# Patient Record
Sex: Male | Born: 1980 | Race: White | Hispanic: No | Marital: Married | Smoking: Never smoker
Health system: Southern US, Community
[De-identification: ages and names within clinical notes are randomized; demographics above are authoritative.]

## PROBLEM LIST (undated history)

## (undated) DIAGNOSIS — K4090 Unilateral inguinal hernia, without obstruction or gangrene, not specified as recurrent: Secondary | ICD-10-CM

## (undated) HISTORY — DX: Unilateral inguinal hernia, without obstruction or gangrene, not specified as recurrent: K40.90

---

## 2007-10-28 ENCOUNTER — Ambulatory Visit: Payer: Self-pay | Admitting: General Surgery

## 2007-10-28 HISTORY — PX: HERNIA REPAIR: SHX51

## 2015-06-03 ENCOUNTER — Encounter: Payer: Self-pay | Admitting: Family Medicine

## 2015-06-03 ENCOUNTER — Ambulatory Visit (INDEPENDENT_AMBULATORY_CARE_PROVIDER_SITE_OTHER): Payer: BLUE CROSS/BLUE SHIELD | Admitting: Family Medicine

## 2015-06-03 VITALS — BP 116/78 | HR 89 | Temp 98.6°F | Resp 16 | Wt 171.0 lb

## 2015-06-03 DIAGNOSIS — J069 Acute upper respiratory infection, unspecified: Secondary | ICD-10-CM | POA: Diagnosis not present

## 2015-06-03 DIAGNOSIS — R05 Cough: Secondary | ICD-10-CM | POA: Diagnosis not present

## 2015-06-03 DIAGNOSIS — R059 Cough, unspecified: Secondary | ICD-10-CM

## 2015-06-03 MED ORDER — PROMETHAZINE-DM 6.25-15 MG/5ML PO SYRP: 5.0000 mL | ORAL_SOLUTION | Freq: Four times a day (QID) | ORAL | Status: DC | PRN

## 2015-06-03 NOTE — Progress Notes (Signed)
Patient ID: Ronald Graham, male   DOB: 1981-07-12, 34 y.o.   MRN: [id number]   Patient: Ronald Graham Male    DOB: 1980/11/08   34 y.o.   MRN: [id number] Visit Date: 06/03/2015  Today's Provider: Dortha Kern, PA   Chief Complaint  Patient presents with  . Cough    X 2 weeks   Subjective:    Cough This is a new problem. The current episode started 1 to 4 weeks ago. The problem has been gradually worsening. The problem occurs constantly. The cough is productive of sputum.   There are no active problems to display for this patient.  Past Surgical History  Procedure Laterality Date  . Hernia repair  1983   Family History  Problem Relation Age of Onset  . Hernia Brother    Allergies  Allergen Reactions  . Amoxicillin    Previous Medications   No medications on file   Review of Systems  Constitutional: Negative.   HENT: Negative.   Eyes: Negative.   Respiratory: Positive for cough.   Cardiovascular: Negative.   Gastrointestinal: Negative.   Endocrine: Negative.   Genitourinary: Negative.   Musculoskeletal: Negative.   Skin: Negative.   Allergic/Immunologic: Negative.   Neurological: Negative.   Hematological: Negative.   Psychiatric/Behavioral: Negative.    Social History  Substance Use Topics  . Smoking status: Never Smoker   . Smokeless tobacco: Former Neurosurgeon     Comment: QUIT IN 2016  . Alcohol Use: No   Objective:   BP 116/78 mmHg  Pulse 89  Temp(Src) 98.6 F (37 C) (Oral)  Resp 16  Wt 171 lb (77.565 kg)  SpO2 98%  Physical Exam  Constitutional: He is oriented to person, place, and time. He appears well-developed and well-nourished. No distress.  HENT:  Head: Normocephalic and atraumatic.  Right Ear: Hearing normal.  Left Ear: Hearing normal.  Nose: Nose normal.  Eyes: Conjunctivae, EOM and lids are normal. Right eye exhibits no discharge. Left eye exhibits no discharge. No scleral icterus.  Neck: Normal range of motion. Neck supple.   Cardiovascular: Normal rate and regular rhythm.   Pulmonary/Chest: Effort normal. No respiratory distress.  Abdominal: Soft. Bowel sounds are normal.  Musculoskeletal: Normal range of motion.  Neurological: He is alert and oriented to person, place, and time.  Skin: Skin is intact. No lesion and no rash noted.  Psychiatric: He has a normal mood and affect. His speech is normal and behavior is normal. Thought content normal.      Assessment & Plan:     1. Cough Intermittent over the past couple weeks. No fever and occasional clear sputum production. Masayoshi treat with cough syrup and should recheck if no better in 5-7 days. - promethazine-dextromethorphan (PROMETHAZINE-DM) 6.25-15 MG/5ML syrup; Take 5 mLs by mouth 4 (four) times daily as needed for cough.  Dispense: 118 mL; Refill: 0  2. Upper respiratory infection No nasal congestion today. Son had similar symptoms of cough and congestion but he is all clear now. Increase fluid intake and use cough syrup prn. Recheck prn.

## 2015-08-29 ENCOUNTER — Ambulatory Visit (INDEPENDENT_AMBULATORY_CARE_PROVIDER_SITE_OTHER): Payer: BLUE CROSS/BLUE SHIELD | Admitting: Family Medicine

## 2015-08-29 ENCOUNTER — Encounter: Payer: Self-pay | Admitting: Family Medicine

## 2015-08-29 VITALS — BP 120/70 | HR 78 | Temp 98.6°F | Resp 16 | Wt 171.2 lb

## 2015-08-29 DIAGNOSIS — R1032 Left lower quadrant pain: Secondary | ICD-10-CM | POA: Diagnosis not present

## 2015-08-29 DIAGNOSIS — N50812 Left testicular pain: Secondary | ICD-10-CM | POA: Diagnosis not present

## 2015-08-29 MED ORDER — KETOROLAC TROMETHAMINE 60 MG/2ML IM SOLN
60.0000 mg | Freq: Once | INTRAMUSCULAR | Status: AC
  Administered 2015-08-29: 60 mg via INTRAMUSCULAR

## 2015-08-29 NOTE — Patient Instructions (Signed)
May

## 2015-08-29 NOTE — Progress Notes (Signed)
Subjective:     Patient ID: Ronald Graham, male   DOB: Jan 18, 1981, 34 y.o.   MRN: [id number]  HPI  Chief Complaint  Patient presents with  . Groin Pain    Patient states that yesterday while at work he felt like he had pulled a muscle in his groin, later on in the evening pain increased and patient went to [facility] urgent care. Patient was diagnosed with an inguinal hernia, patient would like to discuss pain managment and surgery for the near future  Reports prior hx of left inguinal hernia repair per Dr. Lemar LivingsByrnett years ago.Married and monogamous. States pain at times radiates to his left testicle. Treating himself with Tylenol.   Review of Systems  Constitutional: Negative for fever and chills.  Genitourinary: Negative for dysuria.       Objective:   Physical Exam  Constitutional: He appears well-developed and well-nourished. He appears distressed (moderate pain).  Genitourinary:  No bulge noted with Valsalva. Tender over left groin, testicle, and inguinal canal. No testicle mass.       Assessment:    1. Groin pain, left - Ambulatory referral to General Surgery - ketorolac (TORADOL) injection 60 mg; Inject 2 mLs (60 mg total) into the muscle once.  2. Pain in left testicle - Ambulatory referral to General Surgery    Plan:    Defers narcotic medication. If pain worsens to report to the ER.

## 2015-08-30 ENCOUNTER — Telehealth: Payer: Self-pay | Admitting: Family Medicine

## 2015-08-30 NOTE — Telephone Encounter (Signed)
States his pain is about the same but localized to his groin. He has an appointment with Dr. Lemar LivingsByrnett on 12/12. Discussed use of nsaid's in addition to prior rx for Tylenol with codeine he is filling today.

## 2015-08-30 NOTE — Telephone Encounter (Signed)
Returned Ronald Graham's call from this morning.

## 2015-09-02 ENCOUNTER — Ambulatory Visit (INDEPENDENT_AMBULATORY_CARE_PROVIDER_SITE_OTHER): Payer: BLUE CROSS/BLUE SHIELD | Admitting: General Surgery

## 2015-09-02 ENCOUNTER — Encounter: Payer: Self-pay | Admitting: General Surgery

## 2015-09-02 ENCOUNTER — Other Ambulatory Visit: Payer: BLUE CROSS/BLUE SHIELD

## 2015-09-02 VITALS — BP 128/78 | HR 80 | Resp 14 | Ht 72.0 in | Wt 166.0 lb

## 2015-09-02 DIAGNOSIS — R1032 Left lower quadrant pain: Secondary | ICD-10-CM | POA: Insufficient documentation

## 2015-09-02 DIAGNOSIS — R1030 Lower abdominal pain, unspecified: Secondary | ICD-10-CM | POA: Diagnosis not present

## 2015-09-02 NOTE — Patient Instructions (Signed)
Use Ibuprofen 800mg (4 tablets) three times a day. May use heat or ice as needed for comfort. Use close fitting under ware like jockey shorts for comfort.

## 2015-09-02 NOTE — Progress Notes (Signed)
Patient ID: Ronald Graham, male   DOB: 06-18-1981, 34 y.o.   MRN: [id number]  Chief Complaint  Patient presents with  . Inguinal Hernia    left    HPI Ronald Graham is a 34 y.o. male here for evaluation of left groin pain. The patient noted discomfort beginning on Wednesday, December 7. The pain was initially in the left groin extending down into the scrotum and testicle. This was in a similar area to where he had had his left inguinal hernia repair in 2009. The patient had presented with a bulge at that time rather than pain.   He had a repair of a left inguinal hernia in 2009 making use of an Ultra Pro mesh.   He first noticed this pain on 08/28/15. He was seen at Urgent Care and was told he had an inguinal hernia. He was later seen by Beckie Busing, PA and referred here for assessment. He states that the pain is worsened with activity and reduces with rest. He thinks he may have some swelling in the area, but he is a little unsure of whether this is new or old.Marland Kitchen He states that the pain sometimes radiates from the groin are to the left testicle.   The patient is a Chartered certified accountant at [employer], Psychiatrist. Lifting is usually less than 20 pounds.  I personally reviewed the patient's clinical history. HPI  Past Medical History  Diagnosis Date  . Inguinal hernia     Past Surgical History  Procedure Laterality Date  . Hernia repair Left 10/28/07    inguinal    Family History  Problem Relation Age of Onset  . Hernia Brother     Social History Social History  Substance Use Topics  . Smoking status: Never Smoker   . Smokeless tobacco: Former Neurosurgeon    Quit date: 09/21/2014     Comment: QUIT IN 2016  . Alcohol Use: 0.0 oz/week    0 Standard drinks or equivalent per week    Allergies  Allergen Reactions  . Amoxicillin     Current Outpatient Prescriptions  Medication Sig Dispense Refill  . acetaminophen-codeine (TYLENOL #3) 300-30 MG tablet Take 1 tablet by mouth as needed.      No current facility-administered medications for this visit.    Review of Systems Review of Systems  Constitutional: Negative.   Respiratory: Negative.   Cardiovascular: Negative.   Gastrointestinal: Negative.   Genitourinary: Negative.     Blood pressure 128/78, pulse 80, resp. rate 14, height 6' (1.829 m), weight 166 lb (75.297 kg).  Physical Exam Physical Exam  Constitutional: He appears well-developed and well-nourished.  Abdominal: Soft. Normal appearance. There is no tenderness. A hernia is present.    Genitourinary: Testes normal. Right testis shows no mass and no tenderness. Left testis shows no mass and no tenderness.  Skin: Skin is warm and dry.  Psychiatric: He has a normal mood and affect.    Data Reviewed Ultrasound examination of the left groin showed a well-defined 0.5 x 0.8 x 0.98 cm lymph node with preservation of normal architecture. This accounts for the palpable lesion above the left inguinal ligament.  Assessment    No evidence of recurrent hernia formation. Likely muscle strain.    Plan    The patient has been asked to make use of ibuprofen, 200 mg tablets, #4, 3 times per day for 10 days. Local heat for comfort. Jockey shorts for support. No activity restrictions. Proper lifting technique reviewed. A return to  work note was provided.    PCP: Rexene Agentennis Chrismon   Lolamae Voisin W 09/02/2015, 3:22 PM

## 2015-11-14 ENCOUNTER — Encounter: Payer: Self-pay | Admitting: General Surgery

## 2015-12-11 ENCOUNTER — Ambulatory Visit (INDEPENDENT_AMBULATORY_CARE_PROVIDER_SITE_OTHER): Payer: BLUE CROSS/BLUE SHIELD | Admitting: Family Medicine

## 2015-12-11 ENCOUNTER — Ambulatory Visit
Admission: RE | Admit: 2015-12-11 | Discharge: 2015-12-11 | Disposition: A | Discharge status: Home / Self Care | Payer: BLUE CROSS/BLUE SHIELD | Source: Ambulatory Visit | Attending: Family Medicine | Admitting: Family Medicine

## 2015-12-11 ENCOUNTER — Encounter: Payer: Self-pay | Admitting: Family Medicine

## 2015-12-11 VITALS — BP 106/82 | HR 66 | Temp 98.3°F | Resp 16 | Wt 168.2 lb

## 2015-12-11 DIAGNOSIS — M79674 Pain in right toe(s): Secondary | ICD-10-CM | POA: Insufficient documentation

## 2015-12-11 DIAGNOSIS — M7989 Other specified soft tissue disorders: Secondary | ICD-10-CM | POA: Diagnosis not present

## 2015-12-11 MED ORDER — CEPHALEXIN 500 MG PO CAPS: 500.0000 mg | ORAL_CAPSULE | Freq: Two times a day (BID) | ORAL | Status: DC

## 2015-12-11 NOTE — Patient Instructions (Signed)
Discussed use of Aleve two pills twice daily with food. We Hakim call with x-ray results.

## 2015-12-11 NOTE — Progress Notes (Signed)
Subjective:     Patient ID: Mammie RussianWill R Swaby, male   DOB: [birth year], 35 y.o.   MRN: [id number]  HPI  Chief Complaint  Patient presents with  . Insect Bite    Patient comes in office today with concerns of possible insect bite to the right foot. Yesterday patient reports soreness, redness and swelling on side of foot.   States he was working in his garden 3/20 prior to the onset of symptoms the following day. Denies injury, sting, or seeing an insect. Reports shoes are well fitting and do not irritate his foot. No hx of gout or MRSA. States he tried an [employer] TechnologyEpsom Salt Soak but he felt more irritation. Has been taking Tylenol for his sx.   Review of Systems     Objective:   Physical Exam  Constitutional: He appears well-developed and well-nourished. He appears distressed (antalgic gait).  Musculoskeletal:  Right fifth toe at MTP mildly inflamed, tender, and swollen. No deformity noted.  DF/PF 5/5/ ankle/foot ligaments stable.    Assessment:    1. Toe pain, right - DG Toe 5th Right; Future - cephALEXin (KEFLEX) 500 MG capsule; Take 1 capsule (500 mg total) by mouth 2 (two) times daily.  Dispense: 14 capsule; Refill: 0    Plan:    Ayaansh cover for infection and inflammation pending x-ray report.

## 2016-02-11 ENCOUNTER — Encounter: Payer: Self-pay | Admitting: Family Medicine

## 2016-02-11 ENCOUNTER — Ambulatory Visit (INDEPENDENT_AMBULATORY_CARE_PROVIDER_SITE_OTHER): Payer: BLUE CROSS/BLUE SHIELD | Admitting: Family Medicine

## 2016-02-11 VITALS — BP 120/80 | HR 81 | Temp 98.2°F | Resp 16 | Ht 72.0 in | Wt 166.6 lb

## 2016-02-11 DIAGNOSIS — Z0000 Encounter for general adult medical examination without abnormal findings: Secondary | ICD-10-CM

## 2016-02-11 NOTE — Progress Notes (Signed)
Patient: Ronald Graham, Male    DOB: 15-Apr-1981, 35 y.o.   MRN: [id number] Visit Date: 02/11/2016  Today's Provider: Dortha Kern, PA   Chief Complaint  Patient presents with  . Annual Exam   Subjective:    Annual physical exam Ronald Graham is a 35 y.o. male who presents today for health maintenance and complete physical. He feels well. He reports exercising daily he walks around the neighbor for an hour and his job is physical. He reports he is sleeping well.He also needs a health screening form.  -----------------------------------------------------------------   Review of Systems  Constitutional: Negative.   HENT: Negative.   Eyes: Negative.   Respiratory: Negative.   Cardiovascular: Negative.   Gastrointestinal: Negative.   Endocrine: Negative.   Genitourinary: Negative.   Musculoskeletal: Negative.   Skin: Negative.   Allergic/Immunologic: Negative.   Neurological: Negative.   Hematological: Negative.   Psychiatric/Behavioral: Negative.     Social History      He  reports that he has never smoked. He uses smokeless tobacco. He reports that he drinks alcohol. He reports that he does not use illicit drugs.       Social History   Social History  . Marital Status: Married    Spouse Name: N/A  . Number of Children: N/A  . Years of Education: N/A   Social History Main Topics  . Smoking status: Never Smoker   . Smokeless tobacco: Current User    Last Attempt to Quit: 09/21/2014     Comment: QUIT IN 2016 note:02/11/16 patient reports that he started back up  . Alcohol Use: 0.0 oz/week    0 Standard drinks or equivalent per week  . Drug Use: No  . Sexual Activity: Not Asked   Other Topics Concern  . None   Social History Narrative    Past Medical History  Diagnosis Date  . Inguinal hernia      There are no active problems to display for this patient.   Past Surgical History  Procedure Laterality Date  . Hernia repair Left 10/28/07      inguinal    Family History        Family Status  Relation Status Death Age  . Brother Alive   . Father Alive   . Mother Alive         His family history includes Hernia in his brother.    Allergies  Allergen Reactions  . Amoxicillin Rash    Previous Medications   ACETAMINOPHEN (TYLENOL) 500 MG TABLET    Take 500 mg by mouth every 6 (six) hours as needed. Reported on 02/11/2016   CEPHALEXIN (KEFLEX) 500 MG CAPSULE    Take 1 capsule (500 mg total) by mouth 2 (two) times daily.    Patient Care Team: Tamsen Roers, PA as PCP - General (Physician Assistant) Earline Mayotte, MD as Consulting Physician (General Surgery) Anola Gurney, PA as Referring Physician (Family Medicine)     Objective:   Vitals: BP 120/80 mmHg  Pulse 81  Temp(Src) 98.2 F (36.8 C) (Oral)  Resp 16  Ht 6' (1.829 m)  Wt 166 lb 9.6 oz (75.569 kg)  BMI 22.59 kg/m2   Physical Exam  Constitutional: He is oriented to person, place, and time. He appears well-developed and well-nourished.  HENT:  Head: Normocephalic and atraumatic.  Right Ear: External ear normal.  Left Ear: External ear normal.  Nose: Nose normal.  Mouth/Throat: Oropharynx is clear and  moist.  Eyes: Conjunctivae and EOM are normal. Pupils are equal, round, and reactive to light. Right eye exhibits no discharge.  Neck: Normal range of motion. Neck supple. No tracheal deviation present. No thyromegaly present.  Cardiovascular: Normal rate, regular rhythm, normal heart sounds and intact distal pulses.   No murmur heard. Pulmonary/Chest: Effort normal and breath sounds normal. No respiratory distress. He has no wheezes. He has no rales. He exhibits no tenderness.  Abdominal: Soft. He exhibits no distension and no mass. There is no tenderness. There is no rebound and no guarding.  Musculoskeletal: Normal range of motion. He exhibits no edema or tenderness.  Lymphadenopathy:    He has no cervical adenopathy.  Neurological: He is  alert and oriented to person, place, and time. He has normal reflexes. No cranial nerve deficit. He exhibits normal muscle tone. Coordination normal.  Skin: Skin is warm and dry. No rash noted. No erythema.  Psychiatric: He has a normal mood and affect. His behavior is normal. Judgment and thought content normal.    Depression Screen PHQ 2/9 Scores 02/11/2016  PHQ - 2 Score 0    Assessment & Plan:     Routine Health Maintenance and Physical Exam  Exercise Activities and Dietary recommendations Goals    Walking with wife frequently for exercise.     Feeling well and good general health. Rakin get routine labs and consider tetanus up date pending lab reports. No specific complaint or concern. Recheck prn.   Health Maintenance  Topic Date Due  . HIV Screening  05/14/1997  . TETANUS/TDAP  05/14/2001  . INFLUENZA VACCINE  04/21/2016      Discussed health benefits of physical activity, and encouraged him to engage in regular exercise appropriate for his age and condition.    --------------------------------------------------------------------

## 2016-02-11 NOTE — Patient Instructions (Signed)

## 2016-02-12 DIAGNOSIS — Z0000 Encounter for general adult medical examination without abnormal findings: Secondary | ICD-10-CM | POA: Diagnosis not present

## 2016-02-13 ENCOUNTER — Telehealth: Payer: Self-pay | Admitting: Emergency Medicine

## 2016-02-13 NOTE — Telephone Encounter (Signed)
Pt returned call. I advised pt and he stated he would pick them up this afternoon. Thanks TNP

## 2016-02-13 NOTE — Telephone Encounter (Signed)
Tried to call pt. LMTCB-- form filled out and ready for pick up. There is a section that he has to fill out.

## 2016-02-14 LAB — CBC WITH DIFFERENTIAL/PLATELET
Basophils Absolute: 0 10*3/uL (ref 0.0–0.2)
Basos: 0 %
EOS (ABSOLUTE): 0.2 10*3/uL (ref 0.0–0.4)
EOS: 2 %
HEMATOCRIT: 36.3 % — AB (ref 37.5–51.0)
Hemoglobin: 12.1 g/dL — ABNORMAL LOW (ref 12.6–17.7)
Immature Grans (Abs): 0 10*3/uL (ref 0.0–0.1)
Immature Granulocytes: 0 %
LYMPHS ABS: 2.7 10*3/uL (ref 0.7–3.1)
Lymphs: 36 %
MCH: 27 pg (ref 26.6–33.0)
MCHC: 33.3 g/dL (ref 31.5–35.7)
MCV: 81 fL (ref 79–97)
MONOS ABS: 0.7 10*3/uL (ref 0.1–0.9)
Monocytes: 9 %
NEUTROS ABS: 4 10*3/uL (ref 1.4–7.0)
Neutrophils: 53 %
Platelets: 390 10*3/uL — ABNORMAL HIGH (ref 150–379)
RBC: 4.48 x10E6/uL (ref 4.14–5.80)
RDW: 13.3 % (ref 12.3–15.4)
WBC: 7.6 10*3/uL (ref 3.4–10.8)

## 2016-02-14 LAB — LIPID PANEL
CHOLESTEROL TOTAL: 187 mg/dL (ref 100–199)
Chol/HDL Ratio: 3.3 ratio units (ref 0.0–5.0)
HDL: 57 mg/dL (ref >39)
LDL Calculated: 105 mg/dL — ABNORMAL HIGH (ref 0–99)
TRIGLYCERIDES: 126 mg/dL (ref 0–149)
VLDL Cholesterol Cal: 25 mg/dL (ref 5–40)

## 2016-02-14 LAB — COMPREHENSIVE METABOLIC PANEL
A/G RATIO: 1.9 (ref 1.2–2.2)
ALBUMIN: 4.5 g/dL (ref 3.5–5.5)
ALK PHOS: 80 IU/L (ref 39–117)
ALT: 18 IU/L (ref 0–44)
AST: 19 IU/L (ref 0–40)
BILIRUBIN TOTAL: 0.4 mg/dL (ref 0.0–1.2)
BUN / CREAT RATIO: 20 (ref 9–20)
BUN: 16 mg/dL (ref 6–20)
CHLORIDE: 97 mmol/L (ref 96–106)
CO2: 23 mmol/L (ref 18–29)
CREATININE: 0.79 mg/dL (ref 0.76–1.27)
Calcium: 9.4 mg/dL (ref 8.7–10.2)
GFR calc Af Amer: 136 mL/min/{1.73_m2} (ref >59)
GFR calc non Af Amer: 118 mL/min/{1.73_m2} (ref >59)
GLOBULIN, TOTAL: 2.4 g/dL (ref 1.5–4.5)
Glucose: 90 mg/dL (ref 65–99)
POTASSIUM: 4.3 mmol/L (ref 3.5–5.2)
SODIUM: 139 mmol/L (ref 134–144)
Total Protein: 6.9 g/dL (ref 6.0–8.5)

## 2016-02-14 LAB — TSH: TSH: 1.21 u[IU]/mL (ref 0.450–4.500)

## 2016-02-14 LAB — NICOTINE/COTININE METABOLITES
COTININE: 326.3 ng/mL
NICOTINE: 22.3 ng/mL

## 2016-09-22 ENCOUNTER — Ambulatory Visit (INDEPENDENT_AMBULATORY_CARE_PROVIDER_SITE_OTHER): Payer: BLUE CROSS/BLUE SHIELD | Admitting: Family Medicine

## 2016-09-22 ENCOUNTER — Encounter: Payer: Self-pay | Admitting: Family Medicine

## 2016-09-22 VITALS — BP 104/68 | HR 88 | Temp 99.3°F | Resp 16 | Wt 176.0 lb

## 2016-09-22 DIAGNOSIS — J069 Acute upper respiratory infection, unspecified: Secondary | ICD-10-CM

## 2016-09-22 DIAGNOSIS — B9789 Other viral agents as the cause of diseases classified elsewhere: Secondary | ICD-10-CM

## 2016-09-22 NOTE — Progress Notes (Signed)
Subjective:     Patient ID: Ronald Graham, male   DOB: [birth year], 36 y.o.   MRN: [id number]  HPI  Chief Complaint  Patient presents with  . URI    Patient reports that he has had cough and congestion X 3 days. Patient reports that he feels more congested in his chest. Patient has been taking TheraFlu with no relief.   States his wife is a Engineer, siteschool teacher and his two kids are in daycare.   Review of Systems     Objective:   Physical Exam  Constitutional: He appears well-developed and well-nourished. No distress.  Ears: T.M's intact without inflammation; left partially obscured by cerumen Throat: no tonsillar enlargement or exudate Neck: no cervical adenopathy Lungs: clear     Assessment:    1. Viral upper respiratory tract infection    Plan:    Discussed use of Mucinex D for congestion, Delsym for cough, and Benadryl for postnasal drainage

## 2016-09-22 NOTE — Patient Instructions (Signed)
Discussed use of Mucinex D for congestion, Delsym for cough, and Benadryl for postnasal drainage 

## 2016-11-09 ENCOUNTER — Encounter: Payer: Self-pay | Admitting: *Deleted

## 2016-11-09 ENCOUNTER — Ambulatory Visit
Admission: EM | Admit: 2016-11-09 | Discharge: 2016-11-09 | Disposition: A | Discharge status: Home / Self Care | Payer: BLUE CROSS/BLUE SHIELD | Attending: Family Medicine | Admitting: Family Medicine

## 2016-11-09 DIAGNOSIS — Z024 Encounter for examination for driving license: Secondary | ICD-10-CM

## 2016-11-09 LAB — DEPT OF TRANSP DIPSTICK, URINE (ARMC ONLY)
GLUCOSE, UA: NEGATIVE mg/dL
Hgb urine dipstick: NEGATIVE
Protein, ur: NEGATIVE mg/dL
SPECIFIC GRAVITY, URINE: 1.015 (ref 1.005–1.030)

## 2016-11-09 NOTE — ED Triage Notes (Signed)
DOT physical. 

## 2016-11-09 NOTE — ED Provider Notes (Signed)
CSN: [id number]     Arrival date & time 11/09/16  1423 History   First MD Initiated Contact with Patient 11/09/16 1557     Chief Complaint  Patient presents with  . Commercial Driver's License Exam   (Consider location/radiation/quality/duration/timing/severity/associated sxs/prior Treatment) HPI  36 year old male presents for a DOT physical. 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 Is no past medical history of significance. He does not take any prescription medications. He does not wear glasses for driving. Surgical history is significant for left hernia repair      Past Medical History:  Diagnosis Date  . Inguinal hernia    Past Surgical History:  Procedure Laterality Date  . HERNIA REPAIR Left 10/28/07   inguinal   Family History  Problem Relation Age of Onset  . Hernia Brother    Social History  Substance Use Topics  . Smoking status: Never Smoker  . Smokeless tobacco: Current User    Last attempt to quit: 09/21/2014     Comment: QUIT IN 2016 note:02/11/16 patient reports that he started back up  . Alcohol use 0.0 oz/week    Review of Systems  All other systems reviewed and are negative.   Allergies  Amoxicillin  Home Medications   Prior to Admission medications   Medication Sig Start Date End Date Taking? Authorizing Provider  acetaminophen (TYLENOL) 500 MG tablet Take 500 mg by mouth every 6 (six) hours as needed. Reported on 02/11/2016    Historical Provider, MD   Meds Ordered and Administered this Visit  Medications - No data to display  BP 132/82 (BP Location: Left Arm)   Pulse 73   Temp 98.4 F (36.9 C) (Oral)   Resp 16   Ht 6' (1.829 m)   Wt 181 lb (82.1 kg)   SpO2 99%   BMI 24.55 kg/m  No data found.   Physical Exam  Constitutional:  Referred to DOT physical sheet  Nursing note and vitals reviewed.   Urgent Care Course     Procedures (including critical care time)  Labs  Review Labs Reviewed  DEPT OF TRANSP DIPSTICK, URINE([facility] ONLY)    Imaging Review No results found.   Visual Acuity Review  Right Eye Distance:   Left Eye Distance:   Bilateral Distance:    Right Eye Near:   Left Eye Near:    Bilateral Near:         MDM   1. 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    Lutricia FeilWilliam P Terrion Poblano, PA-C 11/09/16 [phone]

## 2017-06-24 ENCOUNTER — Other Ambulatory Visit: Payer: Self-pay | Admitting: Physician Assistant

## 2017-06-24 ENCOUNTER — Ambulatory Visit
Admission: RE | Admit: 2017-06-24 | Discharge: 2017-06-24 | Disposition: A | Discharge status: Home / Self Care | Payer: Self-pay | Source: Ambulatory Visit | Attending: Physician Assistant | Admitting: Physician Assistant

## 2017-06-24 ENCOUNTER — Ambulatory Visit
Admission: RE | Admit: 2017-06-24 | Discharge: 2017-06-24 | Disposition: A | Discharge status: Home / Self Care | Payer: BLUE CROSS/BLUE SHIELD | Source: Ambulatory Visit | Attending: Physician Assistant | Admitting: Physician Assistant

## 2017-06-24 DIAGNOSIS — M25572 Pain in left ankle and joints of left foot: Secondary | ICD-10-CM

## 2017-06-25 ENCOUNTER — Ambulatory Visit: Payer: Self-pay | Admitting: Family Medicine

## 2017-10-07 ENCOUNTER — Encounter: Payer: Self-pay | Admitting: Family Medicine

## 2017-10-07 ENCOUNTER — Ambulatory Visit (INDEPENDENT_AMBULATORY_CARE_PROVIDER_SITE_OTHER): Payer: BLUE CROSS/BLUE SHIELD | Admitting: Family Medicine

## 2017-10-07 VITALS — BP 122/74 | HR 95 | Temp 98.2°F | Resp 16 | Wt 174.0 lb

## 2017-10-07 DIAGNOSIS — B9689 Other specified bacterial agents as the cause of diseases classified elsewhere: Secondary | ICD-10-CM

## 2017-10-07 DIAGNOSIS — J019 Acute sinusitis, unspecified: Secondary | ICD-10-CM

## 2017-10-07 MED ORDER — FLUTICASONE PROPIONATE 50 MCG/ACT NA SUSP: 2.0000 | Freq: Every day | NASAL | 6 refills | Status: DC

## 2017-10-07 MED ORDER — DOXYCYCLINE HYCLATE 100 MG PO TABS: 100.0000 mg | ORAL_TABLET | Freq: Two times a day (BID) | ORAL | 0 refills | Status: AC

## 2017-10-07 NOTE — Patient Instructions (Signed)

## 2017-10-07 NOTE — Progress Notes (Signed)
Patient: Ronald Graham Male    DOB: 19-Jan-1981   37 y.o.   MRN: [id number] Visit Date: 10/07/2017  Today's Provider: Shirlee LatchAngela Kashaun Bebo, MD   I, Ronald Graham, CMA, am acting as scribe for Ronald LatchAngela Arav Bannister, MD.  Chief Complaint  Patient presents with  . Sinusitis   Subjective:    Sinusitis  This is a new problem. Episode onset: x 1-2 weeks. Progression since onset: felt like he was improving, and then suddenly worsened last night. There has been no fever. The pain is mild. Associated symptoms include congestion, coughing (some sputum; clear to green), a hoarse voice, sinus pressure, sneezing and a sore throat. Pertinent negatives include no chills, diaphoresis, ear pain, headaches, neck pain, shortness of breath or swollen glands. Treatments tried: Cepacol. The treatment provided mild relief.   Getting worse consistently.    Allergies  Allergen Reactions  . Amoxicillin Rash     Current Outpatient Medications:  .  acetaminophen (TYLENOL) 500 MG tablet, Take 500 mg by mouth every 6 (six) hours as needed. Reported on 02/11/2016, Disp: , Rfl:   Review of Systems  Constitutional: Negative for chills and diaphoresis.  HENT: Positive for congestion, hoarse voice, sinus pressure, sneezing and sore throat. Negative for ear pain.   Respiratory: Positive for cough (some sputum; clear to green). Negative for shortness of breath.   Musculoskeletal: Negative for neck pain.  Neurological: Negative for headaches.    Social History   Tobacco Use  . Smoking status: Never Smoker  . Smokeless tobacco: Current User  . Tobacco comment: QUIT IN 2016 note:02/11/16 patient reports that he started back up  Substance Use Topics  . Alcohol use: Yes    Alcohol/week: 0.0 oz   Objective:   BP 122/74 (BP Location: Left Arm, Patient Position: Sitting, Cuff Size: Normal)   Pulse 95   Temp 98.2 F (36.8 C) (Oral)   Resp 16   Wt 174 lb (78.9 kg)   SpO2 99%   BMI 23.60 kg/m  Vitals:   10/07/17 0942  BP: 122/74  Pulse: 95  Resp: 16  Temp: 98.2 F (36.8 C)  TempSrc: Oral  SpO2: 99%  Weight: 174 lb (78.9 kg)     Physical Exam  Constitutional: He is oriented to person, place, and time. He appears well-developed and well-nourished. No distress.  HENT:  Head: Normocephalic and atraumatic.  Right Ear: External ear normal.  Left Ear: External ear normal.  Nose: Mucosal edema present. Right sinus exhibits maxillary sinus tenderness. Right sinus exhibits no frontal sinus tenderness. Left sinus exhibits maxillary sinus tenderness. Left sinus exhibits no frontal sinus tenderness.  Mouth/Throat: Uvula is midline and mucous membranes are normal. Posterior oropharyngeal erythema present. No oropharyngeal exudate.  Eyes: Conjunctivae are normal. Pupils are equal, round, and reactive to light. No scleral icterus.  Neck: Neck supple. No thyromegaly present.  Cardiovascular: Normal rate, regular rhythm, normal heart sounds and intact distal pulses.  Pulmonary/Chest: Effort normal and breath sounds normal. No respiratory distress. He has no wheezes.  Musculoskeletal: He exhibits no edema.  Lymphadenopathy:    He has no cervical adenopathy.  Neurological: He is alert and oriented to person, place, and time.  Skin: Skin is warm and dry. No rash noted.  Psychiatric: He has a normal mood and affect. His behavior is normal.  Vitals reviewed.       Assessment & Plan:      1. Acute bacterial rhinosinusitis - likely viral URI with congestion initially,  now that it has been >10 day duration, Ronald Graham treat empirically for bacterial sinusitis - allergic to amoxicillin, so Ronald Graham treat with doxycycline instead - discussed also using flonase and decongestant for symptom relief - return precautions discussed  Meds ordered this encounter  Medications  . doxycycline (VIBRA-TABS) 100 MG tablet    Sig: Take 1 tablet (100 mg total) by mouth 2 (two) times daily for 7 days.    Dispense:  14  tablet    Refill:  0  . fluticasone (FLONASE) 50 MCG/ACT nasal spray    Sig: Place 2 sprays into both nostrils daily.    Dispense:  16 g    Refill:  6    Return if symptoms worsen or fail to improve.      The entirety of the information documented in the History of Present Illness, Review of Systems and Physical Exam were personally obtained by me. Portions of this information were initially documented by Ronald Graham, CMA and reviewed by me for thoroughness and accuracy.    Erasmo Downer, MD, MPH [facility] 10/07/2017 10:18 AM

## 2018-12-23 IMAGING — CR DG ANKLE COMPLETE 3+V*L*
4 series · 4 of 4 positions shown · non-contrast
Comparison: None.

CLINICAL DATA: Left ankle pain after rolling injury.

EXAM:
LEFT ANKLE COMPLETE - 3+ VIEW

[ankle ap]
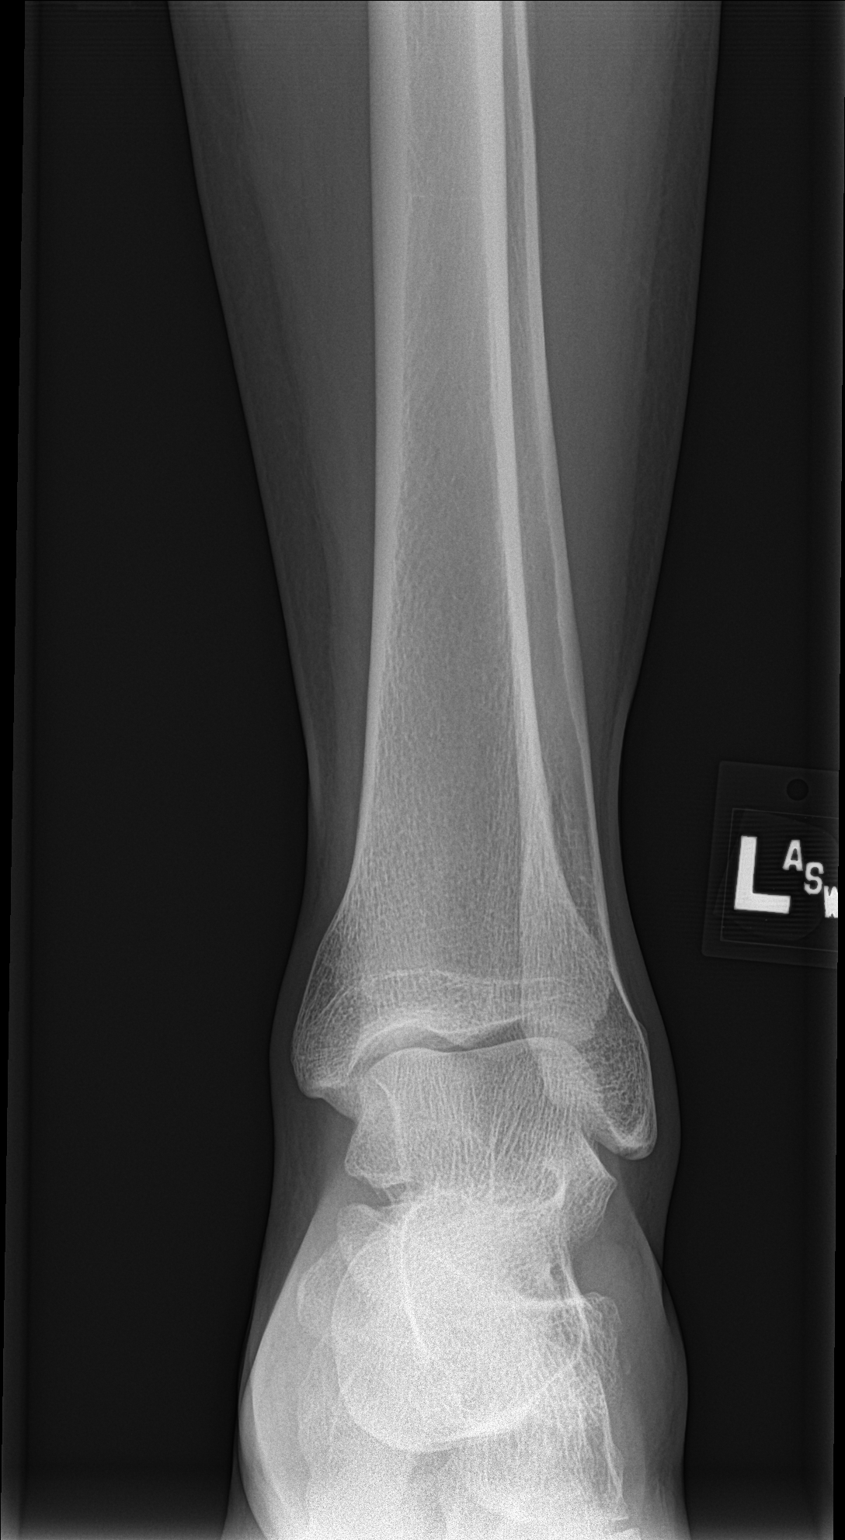

[ankle obl (1 of 2)]
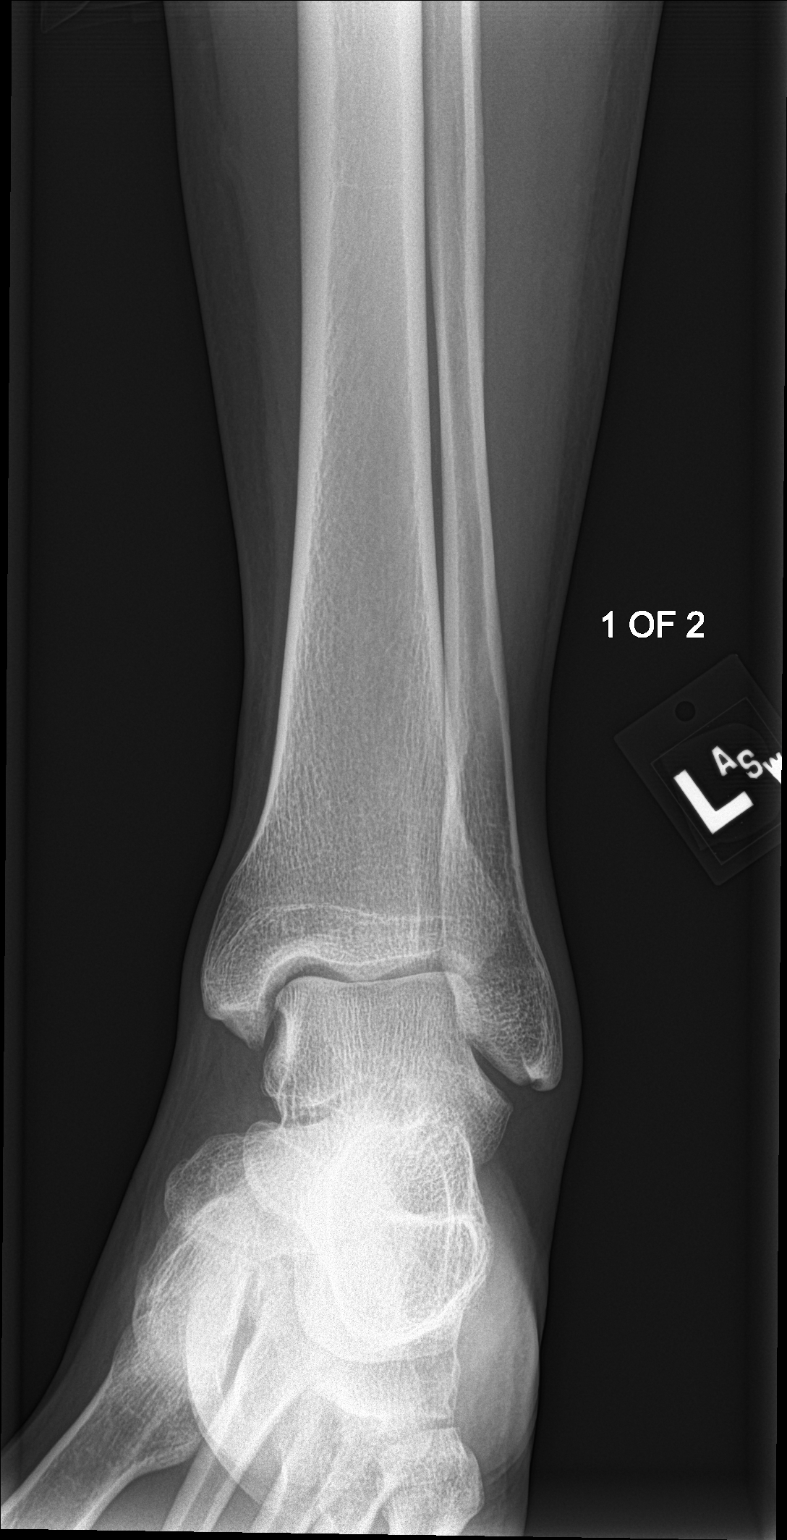

[ankle lat]
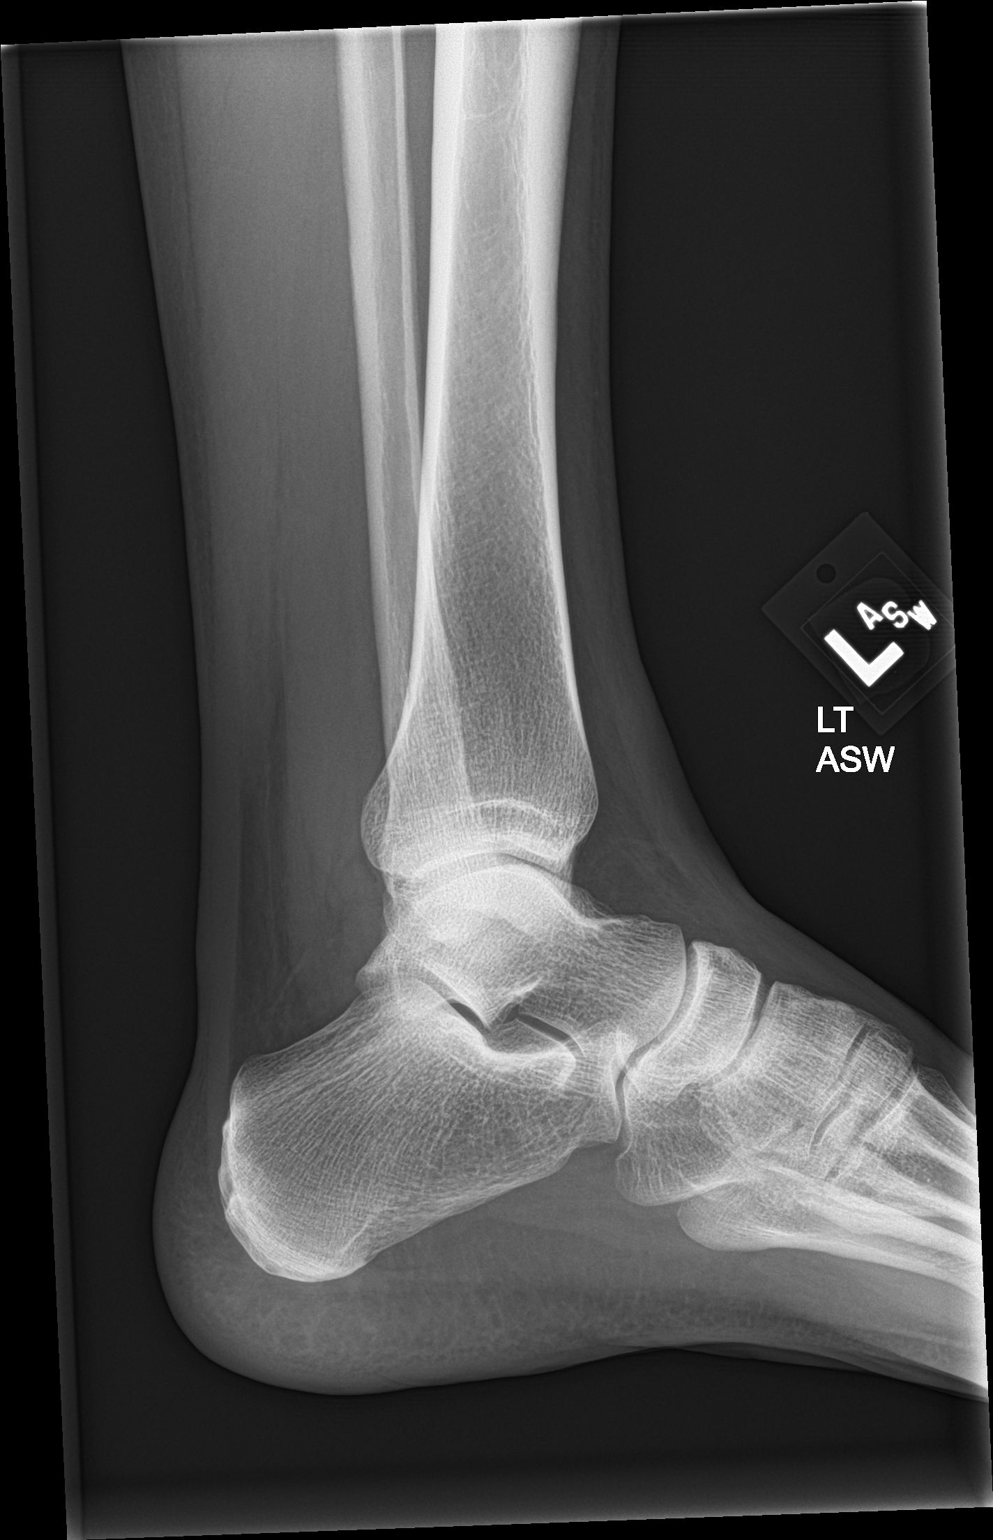

[ankle obl (2 of 2)]
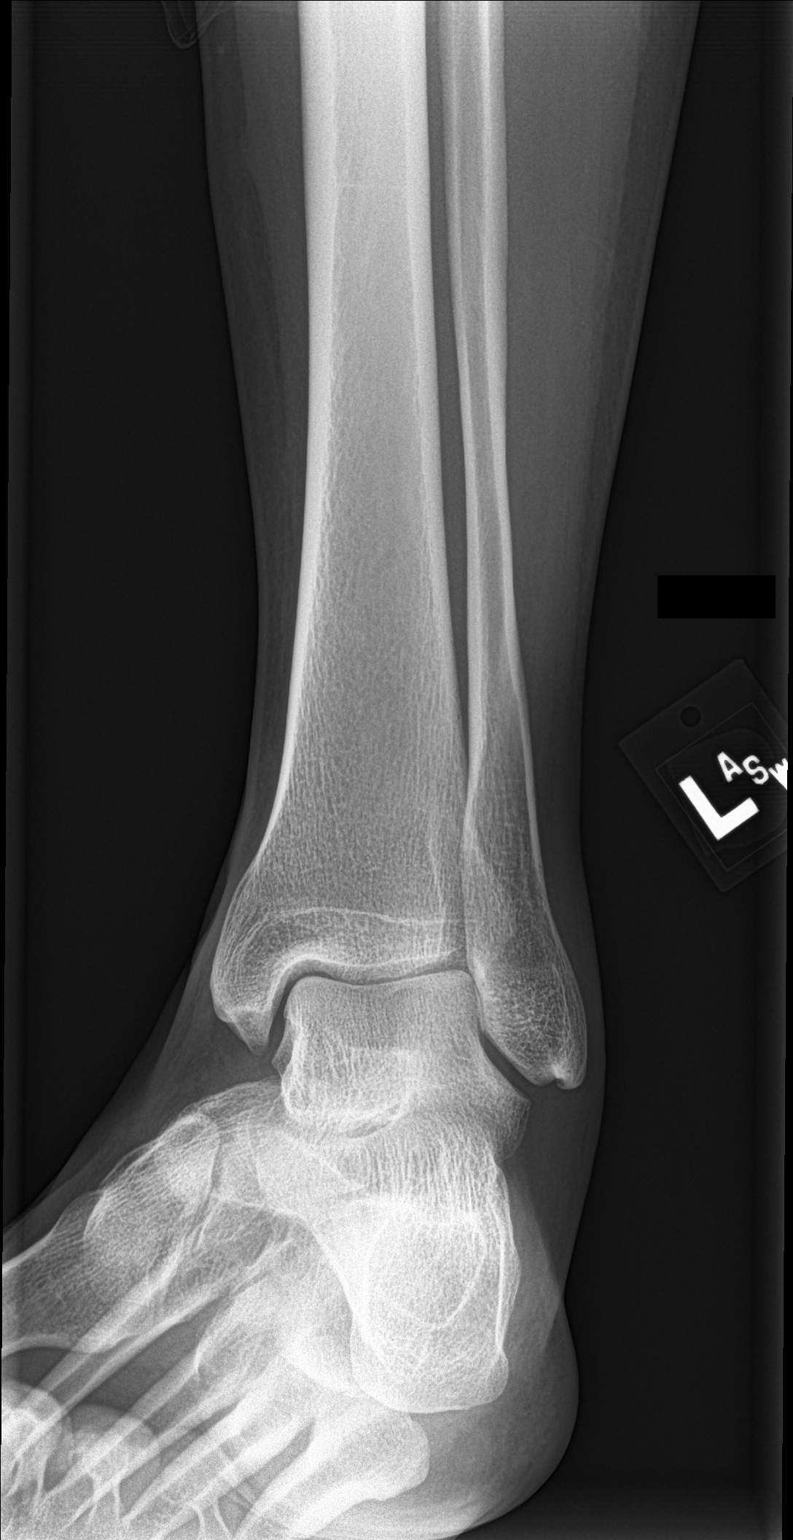

[4 of 4 positions shown; findings below may reference images not displayed]

FINDINGS: There is no evidence of fracture, dislocation, or joint effusion.
There is no evidence of arthropathy or other focal bone abnormality.
Soft tissues are unremarkable.
IMPRESSION: Normal left ankle.

## 2019-10-30 ENCOUNTER — Ambulatory Visit: Payer: Self-pay | Attending: Internal Medicine

## 2019-10-30 DIAGNOSIS — Z20822 Contact with and (suspected) exposure to covid-19: Secondary | ICD-10-CM | POA: Insufficient documentation

## 2019-10-31 LAB — NOVEL CORONAVIRUS, NAA: SARS-CoV-2, NAA: NOT DETECTED

## 2020-12-30 ENCOUNTER — Encounter: Payer: BLUE CROSS/BLUE SHIELD | Admitting: Family Medicine

## 2021-02-03 ENCOUNTER — Encounter: Payer: Self-pay | Admitting: Family Medicine

## 2021-02-03 ENCOUNTER — Ambulatory Visit (INDEPENDENT_AMBULATORY_CARE_PROVIDER_SITE_OTHER): Payer: 59 | Admitting: Family Medicine

## 2021-02-03 ENCOUNTER — Other Ambulatory Visit: Payer: Self-pay

## 2021-02-03 VITALS — BP 140/87 | HR 70 | Temp 98.1°F | Wt 196.0 lb

## 2021-02-03 DIAGNOSIS — Z1159 Encounter for screening for other viral diseases: Secondary | ICD-10-CM

## 2021-02-03 DIAGNOSIS — Z114 Encounter for screening for human immunodeficiency virus [HIV]: Secondary | ICD-10-CM

## 2021-02-03 DIAGNOSIS — Z0000 Encounter for general adult medical examination without abnormal findings: Secondary | ICD-10-CM | POA: Diagnosis not present

## 2021-02-03 NOTE — Progress Notes (Signed)
Complete physical exam   Patient: Ronald Graham   DOB: [birth year]   40 y.o. Male  MRN: [id number] Visit Date: 02/03/2021  Today's healthcare provider: Dortha Kern, PA-C   No chief complaint on file.  Subjective    Ronald Graham is a 40 y.o. male who presents today for a complete physical exam.  He reports consuming a general diet.  He generally feels well. He reports sleeping well. He does not have additional problems to discuss today.    Past Medical History:  Diagnosis Date  . Inguinal hernia    Past Surgical History:  Procedure Laterality Date  . HERNIA REPAIR Left 10/28/07   inguinal   Social History   Socioeconomic History  . Marital status: Married    Spouse name: Not on file  . Number of children: Not on file  . Years of education: Not on file  . Highest education level: Not on file  Occupational History  . Not on file  Tobacco Use  . Smoking status: Never Smoker  . Smokeless tobacco: Current User  . Tobacco comment: QUIT IN 2016 note:02/11/16 patient reports that he started back up  Substance and Sexual Activity  . Alcohol use: Yes    Alcohol/week: 0.0 standard drinks  . Drug use: No  . Sexual activity: Not on file  Other Topics Concern  . Not on file  Social History Narrative  . Not on file   Social Determinants of Health   Financial Resource Strain: Not on file  Food Insecurity: Not on file  Transportation Needs: Not on file  Physical Activity: Not on file  Stress: Not on file  Social Connections: Not on file  Intimate Partner Violence: Not on file   Family Status  Relation Name Status  . Brother  Alive  . Father  Alive  . Mother  Alive   Family History  Problem Relation Age of Onset  . Hernia Brother    Allergies  Allergen Reactions  . Amoxicillin Rash    Patient Care Team: Camrie Stock, Jodell Cipro, PA-C as PCP - General (Physician Assistant) Lemar Livings Merrily Pew, MD as Consulting Physician (General Surgery) Anola Gurney,  [place] as Referring Physician (Family Medicine)   Medications: Outpatient Medications Prior to Visit  Medication Sig  . acetaminophen (TYLENOL) 500 MG tablet Take 500 mg by mouth every 6 (six) hours as needed. Reported on 02/11/2016  . fluticasone (FLONASE) 50 MCG/ACT nasal spray Place 2 sprays into both nostrils daily.   No facility-administered medications prior to visit.    Review of Systems  Constitutional: Negative.   HENT: Negative.   Eyes: Negative.   Respiratory: Negative.   Cardiovascular: Negative.   Gastrointestinal: Negative.   Endocrine: Negative.   Genitourinary: Negative.   Musculoskeletal: Negative.   Skin: Negative.   Allergic/Immunologic: Negative.   Neurological: Negative.   Hematological: Negative.   Psychiatric/Behavioral: Negative.       Objective    BP 140/87 (BP Location: Right Arm, Patient Position: Sitting, Cuff Size: Normal)   Pulse 70   Temp 98.1 F (36.7 C) (Oral)   Wt 196 lb (88.9 kg)   SpO2 99%   BMI 26.58 kg/m  BP Readings from Last 3 Encounters:  02/03/21 140/87  10/07/17 122/74  11/09/16 132/82   Wt Readings from Last 3 Encounters:  02/03/21 196 lb (88.9 kg)  10/07/17 174 lb (78.9 kg)  11/09/16 181 lb (82.1 kg)      Physical Exam Constitutional:  Appearance: Normal appearance. He is normal weight.  HENT:     Head: Normocephalic and atraumatic.     Right Ear: Tympanic membrane, ear canal and external ear normal.     Left Ear: Tympanic membrane, ear canal and external ear normal.     Nose: Nose normal.     Mouth/Throat:     Mouth: Mucous membranes are moist.     Pharynx: Oropharynx is clear.  Eyes:     Extraocular Movements: Extraocular movements intact.     Conjunctiva/sclera: Conjunctivae normal.     Pupils: Pupils are equal, round, and reactive to light.  Cardiovascular:     Rate and Rhythm: Normal rate and regular rhythm.     Pulses: Normal pulses.     Heart sounds: Normal heart sounds.  Pulmonary:     Effort:  Pulmonary effort is normal.     Breath sounds: Normal breath sounds.  Abdominal:     General: Abdomen is flat. Bowel sounds are normal.     Palpations: Abdomen is soft.  Genitourinary:    Penis: Normal.      Testes: Normal.     Prostate: Normal.     Rectum: Normal. Guaiac result negative.  Musculoskeletal:        General: Normal range of motion.     Cervical back: Normal range of motion and neck supple.  Skin:    General: Skin is warm and dry.  Neurological:     General: No focal deficit present.     Mental Status: He is alert and oriented to person, place, and time. Mental status is at baseline.  Psychiatric:        Mood and Affect: Mood normal.        Behavior: Behavior normal.        Thought Content: Thought content normal.        Judgment: Judgment normal.      Last depression screening scores PHQ 2/9 Scores 02/03/2021 02/11/2016  PHQ - 2 Score 0 0  PHQ- 9 Score 0 -   Last fall risk screening Fall Risk  02/03/2021  Falls in the past year? 0  Number falls in past yr: 0  Injury with Fall? 0   Last Audit-C alcohol use screening Alcohol Use Disorder Test (AUDIT) 02/03/2021  1. How often do you have a drink containing alcohol? 2  2. How many drinks containing alcohol do you have on a typical day when you are drinking? 0  3. How often do you have six or more drinks on one occasion? 0  AUDIT-C Score 2   A score of 3 or more in women, and 4 or more in men indicates increased risk for alcohol abuse, EXCEPT if all of the points are from question 1   No results found for any visits on 02/03/21.  Assessment & Plan    Routine Health Maintenance and Physical Exam  Exercise Activities and Dietary recommendations Goals   Recommend 30-40 minutes of exercise 3-4 days a week and low fat diet.      There is no immunization history on file for this patient.  Health Maintenance  Topic Date Due  . COVID-19 Vaccine (1) Never done  . HIV Screening  Never done  . Hepatitis C  Screening  Never done  . TETANUS/TDAP  Never done  . INFLUENZA VACCINE  04/21/2021  . HPV VACCINES  Aged Out    Discussed health benefits of physical activity, and encouraged him to engage in regular exercise  appropriate for his age and condition.  1. Annual physical exam Good general health. Declines immunizations. Check routine labs. - CBC with Differential/Platelet - Comprehensive metabolic panel - Lipid panel - TSH  2. Need for hepatitis C screening test - Hepatitis C antibody  3. Screening for HIV (human immunodeficiency virus) - HIV Antibody (routine testing w rflx)   No follow-ups on file.     I, Nysha Koplin, PA-C, have reviewed all documentation for this visit. The documentation on 02/03/21 for the exam, diagnosis, procedures, and orders are all accurate and complete.    Dortha Kern, PA-C  [facility] [phone] (phone) [phone] (fax)  [facility]

## 2021-02-04 LAB — HEPATITIS C ANTIBODY: Hep C Virus Ab: <0.1 s/co ratio (ref 0.0–0.9)

## 2021-02-04 LAB — LIPID PANEL
Chol/HDL Ratio: 3.8 ratio (ref 0.0–5.0)
Cholesterol, Total: 218 mg/dL — ABNORMAL HIGH (ref 100–199)
HDL: 58 mg/dL (ref >39)
LDL Chol Calc (NIH): 132 mg/dL — ABNORMAL HIGH (ref 0–99)
Triglycerides: 161 mg/dL — ABNORMAL HIGH (ref 0–149)
VLDL Cholesterol Cal: 28 mg/dL (ref 5–40)

## 2021-02-04 LAB — COMPREHENSIVE METABOLIC PANEL
ALT: 24 IU/L (ref 0–44)
AST: 17 IU/L (ref 0–40)
Albumin/Globulin Ratio: 2 (ref 1.2–2.2)
Albumin: 4.5 g/dL (ref 4.0–5.0)
Alkaline Phosphatase: 89 IU/L (ref 44–121)
BUN/Creatinine Ratio: 15 (ref 9–20)
BUN: 14 mg/dL (ref 6–20)
Bilirubin Total: 0.4 mg/dL (ref 0.0–1.2)
CO2: 22 mmol/L (ref 20–29)
Calcium: 9.9 mg/dL (ref 8.7–10.2)
Chloride: 100 mmol/L (ref 96–106)
Creatinine, Ser: 0.94 mg/dL (ref 0.76–1.27)
Globulin, Total: 2.2 g/dL (ref 1.5–4.5)
Glucose: 95 mg/dL (ref 65–99)
Potassium: 4.4 mmol/L (ref 3.5–5.2)
Sodium: 136 mmol/L (ref 134–144)
Total Protein: 6.7 g/dL (ref 6.0–8.5)
eGFR: 106 mL/min/{1.73_m2} (ref >59)

## 2021-02-04 LAB — CBC WITH DIFFERENTIAL/PLATELET
Basophils Absolute: 0.1 10*3/uL (ref 0.0–0.2)
Basos: 1 %
EOS (ABSOLUTE): 0.1 10*3/uL (ref 0.0–0.4)
Eos: 1 %
Hematocrit: 44.5 % (ref 37.5–51.0)
Hemoglobin: 14.9 g/dL (ref 13.0–17.7)
Immature Grans (Abs): 0 10*3/uL (ref 0.0–0.1)
Immature Granulocytes: 0 %
Lymphocytes Absolute: 2.7 10*3/uL (ref 0.7–3.1)
Lymphs: 31 %
MCH: 29.1 pg (ref 26.6–33.0)
MCHC: 33.5 g/dL (ref 31.5–35.7)
MCV: 87 fL (ref 79–97)
Monocytes Absolute: 0.6 10*3/uL (ref 0.1–0.9)
Monocytes: 8 %
Neutrophils Absolute: 5.1 10*3/uL (ref 1.4–7.0)
Neutrophils: 59 %
Platelets: 343 10*3/uL (ref 150–450)
RBC: 5.12 x10E6/uL (ref 4.14–5.80)
RDW: 12.6 % (ref 11.6–15.4)
WBC: 8.5 10*3/uL (ref 3.4–10.8)

## 2021-02-04 LAB — HIV ANTIBODY (ROUTINE TESTING W REFLEX): HIV Screen 4th Generation wRfx: NONREACTIVE

## 2021-02-04 LAB — TSH: TSH: 1.81 u[IU]/mL (ref 0.450–4.500)

## 2021-02-19 ENCOUNTER — Encounter: Payer: Self-pay | Admitting: Family Medicine

## 2021-10-30 ENCOUNTER — Encounter: Payer: Self-pay | Admitting: Physician Assistant

## 2021-10-30 ENCOUNTER — Other Ambulatory Visit: Payer: Self-pay

## 2021-10-30 ENCOUNTER — Ambulatory Visit (INDEPENDENT_AMBULATORY_CARE_PROVIDER_SITE_OTHER): Payer: 59 | Admitting: Physician Assistant

## 2021-10-30 VITALS — BP 131/96 | HR 76 | Ht 72.0 in | Wt 195.8 lb

## 2021-10-30 DIAGNOSIS — B353 Tinea pedis: Secondary | ICD-10-CM

## 2021-10-30 DIAGNOSIS — R030 Elevated blood-pressure reading, without diagnosis of hypertension: Secondary | ICD-10-CM | POA: Diagnosis not present

## 2021-10-30 DIAGNOSIS — E782 Mixed hyperlipidemia: Secondary | ICD-10-CM

## 2021-10-30 DIAGNOSIS — L74513 Primary focal hyperhidrosis, soles: Secondary | ICD-10-CM | POA: Diagnosis not present

## 2021-10-30 MED ORDER — MICONAZOLE NITRATE 2 % EX AERP: INHALATION_SPRAY | CUTANEOUS | 0 refills | Status: DC

## 2021-10-30 MED ORDER — ALUMINUM CHLORIDE 20 % EX SOLN: Freq: Every day | CUTANEOUS | 1 refills | Status: DC

## 2021-10-30 MED ORDER — KETOCONAZOLE 2 % EX CREA: 1.0000 "application " | TOPICAL_CREAM | Freq: Every day | CUTANEOUS | 1 refills | Status: DC

## 2021-10-30 NOTE — Assessment & Plan Note (Signed)
Can try antiperspirant topically at bedtime.

## 2021-10-30 NOTE — Patient Instructions (Signed)
Substitutions to help cholesterol:  Substituting soy-based products (such as tofu or tempeh) for any meat in recipes  Substituting a lean meat such as (non-fried) poultry or fish (other than shrimp) for red meat in recipes  Replacing refined grain products with higher-fiber whole-grain products whenever possible  Drinking tea, carbonated water, or plain water instead of sugar-sweetened soft drinks and fruit juices  Using a nut butter spread instead of traditional dairy butter   When looking for a fish oil supplement you'll want to make sure it has both EPA and DHA in the ingredient list.   My favorite fish oil supplement is the [employer], this is a high dose and you can see improvement faster than other supplements--but it is expensive.   A more affordable alterative is [employer] (usually also comes with vitamin D). This is a much lower dose of fish oil so it takes longer to see improvement, but is good quality and easier on the wallet.  Be sure to check the serving size for any supplement--most of the fish oil ones are 2 pills in one serving. Also, it is common to have an aftertaste to these, so taking with food or lots of water is good.  If you want a vegetarian supplement [employer] also makes an "algae omega' which doesn't actually use any fish oil.

## 2021-10-30 NOTE — Assessment & Plan Note (Signed)
Ronald Graham recheck cholesterol. Advised fine with trying to make diet changes, but if LDL reaches 170s, need to consider statin Not currently taking any supplements, if he chooses to, would prefer fish oil.

## 2021-10-30 NOTE — Progress Notes (Signed)
I,Ronald Graham,acting as a Neurosurgeon for [employer], PA-C.,have documented all relevant documentation on the behalf of Ronald Ferguson, PA-C,as directed by  Ronald Ferguson, PA-C while in the presence of Ronald Ferguson, PA-C.   Established patient visit   Patient: Ronald Graham   DOB: 09/01/1981   40 y.o. Male  MRN: [id number] Visit Date: 10/30/2021  Today's healthcare provider: Alfredia Ferguson, PA-C   Cc. Smelly feet, concerns over sleep apnea  Subjective    HPI   Ronald Graham is a 41 y/o male who presents today with concerns over smelly feet and his wife's concerns over sleep apnea.   He reports always working with damp feet due to job, having excessive sweating and smell despite washing soles of shoes and showering twice a day. Uses OTC antifungals.  Denies rash, itching, redness.   His wife and brother are concerned he has sleep apnea, he reports snoring and when sitting up having an episode where he feels choked. Only when he falls asleep on the couch sitting up. Denies anyone witnessing an apneic episode.    Medications: Outpatient Medications Prior to Visit  Medication Sig   [DISCONTINUED] acetaminophen (TYLENOL) 500 MG tablet Take 500 mg by mouth every 6 (six) hours as needed. Reported on 02/11/2016 (Patient not taking: Reported on 10/30/2021)   [DISCONTINUED] fluticasone (FLONASE) 50 MCG/ACT nasal spray Place 2 sprays into both nostrils daily. (Patient not taking: Reported on 10/30/2021)   No facility-administered medications prior to visit.    Review of Systems  All other systems reviewed and are negative.    Objective    Blood pressure (!) 131/96, pulse 76, height 6' (1.829 m), weight 195 lb 12.8 oz (88.8 kg), SpO2 99 %.   Physical Exam Constitutional:      General: He is awake.     Appearance: He is well-developed.  HENT:     Head: Normocephalic.  Eyes:     Conjunctiva/sclera: Conjunctivae normal.  Cardiovascular:     Rate and Rhythm: Normal rate and regular  rhythm.     Heart sounds: Normal heart sounds.  Pulmonary:     Effort: Pulmonary effort is normal.     Breath sounds: Normal breath sounds.  Feet:     Right foot:     Skin integrity: Skin integrity normal.     Left foot:     Skin integrity: Skin integrity normal.     Comments: No rashes b/l but mild erythema bilateral toes Skin:    General: Skin is warm.  Neurological:     Mental Status: He is alert and oriented to person, place, and time.  Psychiatric:        Attention and Perception: Attention normal.        Mood and Affect: Mood normal.        Speech: Speech normal.        Behavior: Behavior is cooperative.     No results found for any visits on 10/30/21.  Assessment & Plan     Problem List Items Addressed This Visit       Musculoskeletal and Integument   Tinea pedis of both feet    Prevention d/t excessive moisture secondary to job and hyperhidrosis. Rx powder  For shoes and topical for feet when itchy.  Advised to make sure shoes completely dry and to wash inserts on a weekly basis      Relevant Medications   Miconazole Nitrate 2 % AERP   ketoconazole (NIZORAL) 2 % cream  Hyperhidrosis of feet - Primary    Can try antiperspirant topically at bedtime.        Relevant Medications   aluminum chloride (DRYSOL) 20 % external solution     Other   Elevated systolic blood pressure reading without diagnosis of hypertension    Graham monitor and check next visit      Moderate mixed hyperlipidemia not requiring statin therapy    Deitrich recheck cholesterol. Advised fine with trying to make diet changes, but if LDL reaches 170s, need to consider statin Not currently taking any supplements, if he chooses to, would prefer fish oil.       Relevant Orders   Lipid Profile   Comprehensive Metabolic Panel (CMET)    Return in about 6 months (around 04/29/2022) for CPE.      I, Ronald Ferguson, PA-C have reviewed all documentation for this visit. The documentation on   10/30/2021  for the exam, diagnosis, procedures, and orders are all accurate and complete.    Ronald Ferguson, PA-C  [facility] [phone] (phone) [phone] (fax)  [facility]

## 2021-10-30 NOTE — Assessment & Plan Note (Signed)
Prevention d/t excessive moisture secondary to job and hyperhidrosis. Rx powder  For shoes and topical for feet when itchy.  Advised to make sure shoes completely dry and to wash inserts on a weekly basis

## 2021-10-30 NOTE — Assessment & Plan Note (Signed)
Elia monitor and check next visit

## 2021-10-31 LAB — COMPREHENSIVE METABOLIC PANEL
ALT: 29 IU/L (ref 0–44)
AST: 14 IU/L (ref 0–40)
Albumin/Globulin Ratio: 2.1 (ref 1.2–2.2)
Albumin: 4.5 g/dL (ref 4.0–5.0)
Alkaline Phosphatase: 97 IU/L (ref 44–121)
BUN/Creatinine Ratio: 15 (ref 9–20)
BUN: 15 mg/dL (ref 6–20)
Bilirubin Total: 0.4 mg/dL (ref 0.0–1.2)
CO2: 24 mmol/L (ref 20–29)
Calcium: 9.7 mg/dL (ref 8.7–10.2)
Chloride: 104 mmol/L (ref 96–106)
Creatinine, Ser: 1 mg/dL (ref 0.76–1.27)
Globulin, Total: 2.1 g/dL (ref 1.5–4.5)
Glucose: 99 mg/dL (ref 70–99)
Potassium: 4.4 mmol/L (ref 3.5–5.2)
Sodium: 140 mmol/L (ref 134–144)
Total Protein: 6.6 g/dL (ref 6.0–8.5)
eGFR: 98 mL/min/{1.73_m2} (ref >59)

## 2021-10-31 LAB — LIPID PANEL
Chol/HDL Ratio: 4.2 ratio (ref 0.0–5.0)
Cholesterol, Total: 212 mg/dL — ABNORMAL HIGH (ref 100–199)
HDL: 50 mg/dL (ref >39)
LDL Chol Calc (NIH): 133 mg/dL — ABNORMAL HIGH (ref 0–99)
Triglycerides: 162 mg/dL — ABNORMAL HIGH (ref 0–149)
VLDL Cholesterol Cal: 29 mg/dL (ref 5–40)

## 2022-04-01 ENCOUNTER — Ambulatory Visit
Admission: RE | Admit: 2022-04-01 | Discharge: 2022-04-01 | Disposition: A | Discharge status: Home / Self Care | Payer: 59 | Source: Ambulatory Visit | Attending: Physician Assistant | Admitting: Physician Assistant

## 2022-04-01 ENCOUNTER — Ambulatory Visit
Admission: RE | Admit: 2022-04-01 | Discharge: 2022-04-01 | Disposition: A | Discharge status: Home / Self Care | Payer: 59 | Attending: Physician Assistant | Admitting: Physician Assistant

## 2022-04-01 ENCOUNTER — Ambulatory Visit (INDEPENDENT_AMBULATORY_CARE_PROVIDER_SITE_OTHER): Payer: 59 | Admitting: Physician Assistant

## 2022-04-01 ENCOUNTER — Telehealth: Payer: Self-pay | Admitting: Physician Assistant

## 2022-04-01 ENCOUNTER — Encounter: Payer: Self-pay | Admitting: Physician Assistant

## 2022-04-01 VITALS — BP 125/79 | HR 69 | Temp 98.2°F | Resp 16 | Wt 193.7 lb

## 2022-04-01 DIAGNOSIS — S99921A Unspecified injury of right foot, initial encounter: Secondary | ICD-10-CM

## 2022-04-01 NOTE — Progress Notes (Signed)
Hello Ronald Graham ,   Your XR results all are within normal limits.   Conservative therapy: PRICE (protection, relative rest, ice, compression, elevation) recommended.  Elastic bandage/taping to provide support and decrease swelling.  Rest: initially, activity as tolerated. Early mobilization and physical therapy speed recovery/reduce pain: Referral to PT Pete placed later. Weight-bearing, as tolerated Consider crutches if unable to bear weight. Initiate exercises as early as tolerated. Limit to pain-free range of motion. Start mobilization by tracing the alphabet with the foot or toes. Resistance exercises with an elastic band  Ice: Ice for first 3 to 7 days reduces pain and decreases recovery time. Elevation: Elevate ankle to decrease swelling. OTC ibuprofen PRN after/with meals Acetaminophen 650 mg q4-6h (may be combined with NSAID's without risk of adverse drug interaction  Work note [name] be provided for 3-4 days. FU in 2 weeks. Any questions please reach out to the office or message me on MyChart!  Best, Debera Lat, PA-C

## 2022-04-01 NOTE — Progress Notes (Signed)
I,Sulibeya S Dimas,acting as a Neurosurgeon for [employer], PA-C.,have documented all relevant documentation on the behalf of Debera Lat, PA-C,as directed by  [employer], PA-C while in the presence of [employer], PA-C.   Established patient visit   Patient: Ronald Graham   DOB: Jun 05, 1981   41 y.o. Male  MRN: [id number] Visit Date: 04/01/2022  Today's healthcare provider: Debera Lat, PA-C   Chief Complaint  Patient presents with   Foot Injury   Subjective    Patient reports he stepped in hole yesterday in his garden.   Foot Injury  The incident occurred 12 to 24 hours ago. The incident occurred at home. Injury mechanism: stepped in a hole. The pain is present in the right foot. The quality of the pain is described as aching. The pain is moderate. The pain has been Constant since onset. Associated symptoms include an inability to bear weight and numbness. He reports no foreign bodies present. The symptoms are aggravated by weight bearing and movement. He has tried ice for the symptoms. The treatment provided mild relief.    Medications: Outpatient Medications Prior to Visit  Medication Sig   aluminum chloride (DRYSOL) 20 % external solution Apply topically at bedtime. Let dry do not rub in.   ketoconazole (NIZORAL) 2 % cream Apply 1 application topically daily.   Miconazole Nitrate 2 % AERP Apply to affected area up to twice daily   No facility-administered medications prior to visit.    Review of Systems  Constitutional: Negative.  Negative for appetite change.  Respiratory: Negative.  Negative for chest tightness.   Cardiovascular: Negative.  Negative for palpitations.  Gastrointestinal: Negative.   Musculoskeletal:  Positive for gait problem.  Neurological:  Positive for numbness. Negative for weakness.  See HPI     Objective    BP 125/79 (BP Location: Right Arm, Patient Position: Sitting, Cuff Size: Large)   Pulse 69   Temp 98.2 F (36.8 C) (Oral)    Resp 16   Wt 193 lb 11.2 oz (87.9 kg)   SpO2 99%   BMI 26.27 kg/m  BP Readings from Last 3 Encounters:  04/01/22 125/79  10/30/21 (!) 131/96  02/03/21 140/87   Wt Readings from Last 3 Encounters:  04/01/22 193 lb 11.2 oz (87.9 kg)  10/30/21 195 lb 12.8 oz (88.8 kg)  02/03/21 196 lb (88.9 kg)   Physical Exam  Constitutional: He appears healthy.  Pulmonary/Chest: Breath sounds normal.  Musculoskeletal:        General: Tenderness (on palpation/dorsal aspect of right midfoot, with mild swelling) present.     Comments: Antalgic gait Palpable pulses noted   Neurological: He is alert and oriented to person, place, and time.  Skin: Skin is warm.    1. Injury of right foot, initial encounter Cannot bear weight - DG Foot Complete Right; Future Showed:  There is no evidence of fracture or dislocation. There is no evidence of arthropathy or other focal bone abnormality. Soft tissues are unremarkable  Conservative therapy: PRICE (protection, relative rest, ice, compression, elevation) recommended.   Elastic bandage/taping to provide support and decrease swelling.   Rest: initially, activity as tolerated. Early mobilization and physical therapy speed recovery/reduce pain: Referral to PT Janiel placed later. Weight-bearing, as tolerated Consider crutches if unable to bear weight. Initiate exercises as early as tolerated. Limit to pain-free range of motion. Start mobilization by tracing the alphabet with the foot or toes. Resistance exercises with an elastic band   Ice:  Ice for first 3 to 7 days reduces pain and decreases recovery time. Elevation: Elevate ankle to decrease swelling. OTC ibuprofen PRN after/with meals Acetaminophen 650 mg q4-6h (may be combined with NSAID's without risk of adverse drug interaction   Work note Olson be provided for 3-4 days. We could place it online if pt requests. FU in 2 weeks.      The patient was advised to call back or seek an in-person  evaluation if the symptoms worsen or if the condition fails to improve as anticipated.  I discussed the assessment and treatment plan with the patient. The patient was provided an opportunity to ask questions and all were answered. The patient agreed with the plan and demonstrated an understanding of the instructions.  The entirety of the information documented in the History of Present Illness, Review of Systems and Physical Exam were personally obtained by me. Portions of this information were initially documented by the CMA and reviewed by me for thoroughness and accuracy.  Portions of this note were created using dictation software and may contain typographical errors.    Debera Lat, PA-C  [facility] [phone] (phone) [phone] (fax)  [facility]

## 2022-04-01 NOTE — Telephone Encounter (Signed)
Pt was informed about XR results and the further management by CMA, was refused to return for work note and Coban wrap/discussion of his treatment options

## 2022-04-29 ENCOUNTER — Encounter: Payer: Self-pay | Admitting: Physician Assistant

## 2022-04-29 ENCOUNTER — Ambulatory Visit (INDEPENDENT_AMBULATORY_CARE_PROVIDER_SITE_OTHER): Payer: 59 | Admitting: Physician Assistant

## 2022-04-29 VITALS — BP 126/87 | HR 70 | Ht 72.0 in | Wt 192.4 lb

## 2022-04-29 DIAGNOSIS — Z23 Encounter for immunization: Secondary | ICD-10-CM

## 2022-04-29 DIAGNOSIS — E782 Mixed hyperlipidemia: Secondary | ICD-10-CM

## 2022-04-29 DIAGNOSIS — R030 Elevated blood-pressure reading, without diagnosis of hypertension: Secondary | ICD-10-CM

## 2022-04-29 DIAGNOSIS — Z0000 Encounter for general adult medical examination without abnormal findings: Secondary | ICD-10-CM

## 2022-04-29 NOTE — Progress Notes (Signed)
I,Ronald Graham,acting as a Neurosurgeon for [employer], PA-C.,have documented all relevant documentation on the behalf of Ronald Ferguson, PA-C,as directed by  Ronald Ferguson, PA-C while in the presence of Ronald Ferguson, PA-C.   Complete physical exam   Patient: Ronald Graham   DOB: 01-28-1981   40 y.o. Male  MRN: [id number] Visit Date: 04/29/2022  Today's healthcare provider: Alfredia Ferguson, PA-C   Cc. cpe  Subjective    Ronald Graham is a 41 y.o. male who presents today for a complete physical exam.  He reports consuming a general and trying to cut out potatoes  diet. The patient does not participate in regular exercise at present. He generally feels well. He reports sleeping well. He does not have additional problems to discuss today.    Past Medical History:  Diagnosis Date   Inguinal hernia    Past Surgical History:  Procedure Laterality Date   HERNIA REPAIR Left 10/28/07   inguinal   Social History   Socioeconomic History   Marital status: Married    Spouse name: Not on file   Number of children: Not on file   Years of education: Not on file   Highest education level: Not on file  Occupational History   Not on file  Tobacco Use   Smoking status: Never   Smokeless tobacco: Current    Last attempt to quit: 09/21/2014   Tobacco comments:    QUIT IN 2016 note:02/11/16 patient reports that he started back up  Substance and Sexual Activity   Alcohol use: Yes    Alcohol/week: 0.0 standard drinks of alcohol   Drug use: No   Sexual activity: Not on file  Other Topics Concern   Not on file  Social History Narrative   Not on file   Social Determinants of Health   Financial Resource Strain: Not on file  Food Insecurity: Not on file  Transportation Needs: Not on file  Physical Activity: Not on file  Stress: Not on file  Social Connections: Not on file  Intimate Partner Violence: Not on file   Family Status  Relation Name Status   Brother  Alive    Father  Alive   Mother  Alive   Family History  Problem Relation Age of Onset   Hernia Brother    Allergies  Allergen Reactions   Amoxicillin Rash    Patient Care Team: Ronald Ferguson, PA-C as PCP - General (Physician Assistant) Ronald Livings Merrily Pew, MD as Consulting Physician (General Surgery) Ronald Graham, [place] as Referring Physician (Family Medicine)   Medications: No outpatient medications prior to visit.   No facility-administered medications prior to visit.    Review of Systems  Constitutional:  Negative for fatigue and fever.  Respiratory:  Negative for cough and shortness of breath.   Cardiovascular:  Negative for chest pain, palpitations and leg swelling.  Neurological:  Negative for dizziness and headaches.      Objective     Blood pressure 126/87, pulse 70, height 6' (1.829 m), weight 192 lb 6.4 oz (87.3 kg), SpO2 98 %.    Physical Exam Constitutional:      General: He is awake.     Appearance: He is well-developed.  HENT:     Head: Normocephalic.  Eyes:     Conjunctiva/sclera: Conjunctivae normal.  Cardiovascular:     Rate and Rhythm: Normal rate and regular rhythm.     Heart sounds: Normal heart sounds.  Pulmonary:     Effort: Pulmonary  effort is normal.     Breath sounds: Normal breath sounds.  Musculoskeletal:     Right lower leg: No edema.     Left lower leg: No edema.  Skin:    General: Skin is warm.  Neurological:     Mental Status: He is alert and oriented to person, place, and time.  Psychiatric:        Attention and Perception: Attention normal.        Mood and Affect: Mood normal.        Speech: Speech normal.        Behavior: Behavior is cooperative.     Last depression screening scores    04/29/2022    2:03 PM 10/30/2021    1:42 PM 02/03/2021    1:24 PM  PHQ 2/9 Scores  PHQ - 2 Score 0 0 0  PHQ- 9 Score 0 0 0   Last fall risk screening    04/29/2022    2:03 PM  Fall Risk   Falls in the past year? 0  Number falls in past  yr: 0  Injury with Fall? 0  Risk for fall due to : No Fall Risks   Last Audit-C alcohol use screening    04/29/2022    2:03 PM  Alcohol Use Disorder Test (AUDIT)  1. How often do you have a drink containing alcohol? 2  2. How many drinks containing alcohol do you have on a typical day when you are drinking? 0  3. How often do you have six or more drinks on one occasion? 0  AUDIT-C Score 2   A score of 3 or more in women, and 4 or more in men indicates increased risk for alcohol abuse, EXCEPT if all of the points are from question 1   No results found for any visits on 04/29/22.  Assessment & Plan    Routine Health Maintenance and Physical Exam  Exercise Activities and Dietary recommendations --balanced diet high in fiber and protein, low in sugars, carbs, fats. --physical activity/exercise 30 minutes 3-5 times a week     Immunization History  Administered Date(s) Administered   Tdap 04/29/2022    Health Maintenance  Topic Date Due   COVID-19 Vaccine (1) Never done   INFLUENZA VACCINE  04/21/2022   TETANUS/TDAP  04/29/2032   Hepatitis C Screening  Completed   HIV Screening  Completed   HPV VACCINES  Aged Out    Discussed health benefits of physical activity, and encouraged him to engage in regular exercise appropriate for his age and condition.  Problem List Items Addressed This Visit       Other   Elevated blood pressure reading in office without diagnosis of hypertension    Repeat is WNL. Ronald Graham continue to monitor      Moderate mixed hyperlipidemia not requiring statin therapy    Ronald Graham recheck fasting lipids today Advise starting a fish oil supplement      Relevant Orders   Lipid Profile   Comprehensive Metabolic Panel (CMET)   Other Visit Diagnoses     Annual physical exam    -  Primary   Need for Tdap vaccination       Relevant Orders   Tdap vaccine greater than or equal to 7yo IM (Completed)        Return in about 1 year (around 04/30/2023) for CPE.      I, Ronald Ferguson, PA-C have reviewed all documentation for this visit. The documentation on  04/29/2022  for the exam, diagnosis, procedures, and orders are all accurate and complete.  Ronald Kirschner, PA-C [facility] [address] [place], [place], [address] Office: [phone] Fax: [place]

## 2022-04-29 NOTE — Patient Instructions (Signed)
  When looking for a fish oil supplement you'll want to make sure it has both EPA and DHA in the ingredient list.   My favorite fish oil supplement is the [employer] Naturals Ultimate Omega, this is a high dose and you can see improvement faster than other supplements--but it is expensive.   A more affordable alterative is Nature Made Fish Oil (usually also comes with vitamin D). This is a much lower dose of fish oil so it takes longer to see improvement, but is good quality and easier on the wallet.  Be sure to check the serving size for any supplement--most of the fish oil ones are 2 pills in one serving. Also, it is common to have an aftertaste to these, so taking with food or lots of water is good.  If you want a vegetarian supplement [employer] Natur[employer] also makes an "algae omega' which doesn't actually use any fish oil.  

## 2022-04-29 NOTE — Assessment & Plan Note (Signed)
Repeat is WNL. [place] continue to monitor

## 2022-04-29 NOTE — Assessment & Plan Note (Signed)
Shadow recheck fasting lipids today Advise starting a fish oil supplement

## 2022-04-30 LAB — COMPREHENSIVE METABOLIC PANEL
ALT: 25 IU/L (ref 0–44)
AST: 20 IU/L (ref 0–40)
Albumin/Globulin Ratio: 1.9 (ref 1.2–2.2)
Albumin: 4.5 g/dL (ref 4.1–5.1)
Alkaline Phosphatase: 90 IU/L (ref 44–121)
BUN/Creatinine Ratio: 16 (ref 9–20)
BUN: 14 mg/dL (ref 6–20)
Bilirubin Total: 0.6 mg/dL (ref 0.0–1.2)
CO2: 21 mmol/L (ref 20–29)
Calcium: 9.6 mg/dL (ref 8.7–10.2)
Chloride: 100 mmol/L (ref 96–106)
Creatinine, Ser: 0.9 mg/dL (ref 0.76–1.27)
Globulin, Total: 2.4 g/dL (ref 1.5–4.5)
Glucose: 84 mg/dL (ref 70–99)
Potassium: 4.2 mmol/L (ref 3.5–5.2)
Sodium: 135 mmol/L (ref 134–144)
Total Protein: 6.9 g/dL (ref 6.0–8.5)
eGFR: 111 mL/min/{1.73_m2} (ref >59)

## 2022-04-30 LAB — LIPID PANEL
Chol/HDL Ratio: 4.1 ratio (ref 0.0–5.0)
Cholesterol, Total: 239 mg/dL — ABNORMAL HIGH (ref 100–199)
HDL: 58 mg/dL (ref >39)
LDL Chol Calc (NIH): 157 mg/dL — ABNORMAL HIGH (ref 0–99)
Triglycerides: 132 mg/dL (ref 0–149)
VLDL Cholesterol Cal: 24 mg/dL (ref 5–40)

## 2023-03-09 ENCOUNTER — Other Ambulatory Visit: Payer: Self-pay

## 2023-03-09 ENCOUNTER — Ambulatory Visit (INDEPENDENT_AMBULATORY_CARE_PROVIDER_SITE_OTHER): Payer: Self-pay | Admitting: Physician Assistant

## 2023-03-09 VITALS — BP 124/88 | HR 67 | Ht 73.43 in | Wt 208.0 lb

## 2023-03-09 DIAGNOSIS — S46912A Strain of unspecified muscle, fascia and tendon at shoulder and upper arm level, left arm, initial encounter: Secondary | ICD-10-CM

## 2023-03-09 MED ORDER — DICLOFENAC SODIUM 75 MG PO TBEC: 75.0000 mg | DELAYED_RELEASE_TABLET | Freq: Two times a day (BID) | ORAL | 0 refills | Status: DC

## 2023-03-09 NOTE — Progress Notes (Signed)
[facility] 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  Body Part Injured:Left Shoulder  Vital Signs BP 124/88 (BP Location: Right Arm, Patient Position: Sitting)   Pulse 67   Ht 6' 1.43" (1.865 m)   Wt 208 lb (94.3 kg)   SpO2 99%   BMI 27.13 kg/m   Injury Description  Employee was lifting a backpack leaf blower, felt left shoulder "pop".   Provider Note S:  42 y/o M presents to the clinic for left shoulder pain. MOI: Pt was lifting a backpack leaf blower with both hands when he felt his left shoulder "pop". He felt instant pain in the left shoulder radiating down left arm. He continued to work. Denies fall or direct trauma. No previous injury to the same shoulder, however he believes he had PT to the opposite shoulder for a "minor tendon tear", but no surgery. Last night at home he felt pain with pulling his pants down to change or opening a refrigerator door. He also had pain with wearing his pants this morning. He can't pinpoint to which movement causes pain in the shoulder. He denies previous neck injury or recent MVA.   O: Left Shoulder- Slow, but full ROM in all planes.  Mild TTP over the anterior shoulder musculature.  No swelling. NO TTP over the clavicle. No ecchymosis. Non-tender L elbow. Full ROM. Normal grasping strength. Sensation is grossly normal distally.  Cervical spine- Non-tender cervical spinous process. Full ROM of the head and neck.   A/P: Diagnosis  Strain of left shoulder, initial encounter  G40.102V   Discussed my clinical findings with patient. He verbalized understanding. Apply ice as instructed. Perform ROM exercises and stretches as instructed Take medicine as prescribed. Modified duty status as stated below. RTC in 1 week or sooner if any difficulty arises.   Medications Prescribed  Rx- Diclofenac Potassium 75mg  taken BID with food   Referred to none   Return to Work Status Modified duty- No overhead work. No isolated lifting with left arm. No pushing, pulling, lifting over 10 lbs. Ice 2-3 hrs for 20 mins. No use of backpack leaf blower.    Time spent:30 Minutes   Provider Signature ________________________________________Date_________   Employee Signature _______________________________________Date_________   Please email this completed form to Angus Seller, Director of Risk Management at [email or web address] within 24 hours of visit.

## 2023-03-15 ENCOUNTER — Encounter: Payer: Self-pay | Admitting: Physician Assistant

## 2023-03-15 ENCOUNTER — Ambulatory Visit (INDEPENDENT_AMBULATORY_CARE_PROVIDER_SITE_OTHER): Payer: Self-pay | Admitting: Physician Assistant

## 2023-03-15 ENCOUNTER — Other Ambulatory Visit: Payer: Self-pay

## 2023-03-15 VITALS — BP 121/84 | HR 86 | Temp 98.3°F

## 2023-03-15 DIAGNOSIS — S46912D Strain of unspecified muscle, fascia and tendon at shoulder and upper arm level, left arm, subsequent encounter: Secondary | ICD-10-CM

## 2023-03-15 NOTE — Progress Notes (Signed)
[facility] [address]  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  Body Part Injured:Left Shoulder  Vital Signs BP 121/84   Pulse 86   Temp 98.3 F (36.8 C) (Tympanic)   SpO2 96%   Injury Description  Employee was lifting a backpack leaf blower, felt left shoulder "pop".    Provider Note S: 42 y/o M presents to the clinic for a f/u for left shoulder pain. Today he admits to feeling better. Taking Dilofenac Sodium without side effects. Admits he hasn't felt radiating pain down left arm for the past few days. Was on modified duty, but the last day of his work last week he performed regular duty without any pain. Has been stretching and perform his ROM exercises as previously instructed.  O: Left Shoulder: Full ROM. Non-tender. No crepitus. Non-tender left clavicle or AC joint.  Left Elbow: Non-tender. Left hand: Full grasping strength.  Cervical spine: Full ROM.   A/P:  Diagnosis  Strain of left shoulder, subsequent encounter  [id number]  Discussed with patient to take Diclofenac medicine only as needed.  Jaret continue with ROM exercises and stretches of the left shoulder. Continue modified duty, but [place]anyon increase his weight restrictions.  RTC in 1 week or sooner if any difficulty arises. Pt verbalized understanding and in agreement.    Medications Prescribed  No orders of the defined types were placed in this encounter.   Referred to None    Return to Work Status Modified duty- No overhead work. No isolated lifting  with left arm. No pushing, pulling, lifting over 25 lbs. Ice 2-3 hrs for 20 mins. No use of backpack leaf blower.    Provider Signature ________________________________________Date_________   Employee Signature _______________________________________Date_________   Please email this completed form to Angus Seller, Director of Risk Management at [email or web address] within 24 hours of visit.

## 2023-05-01 NOTE — Progress Notes (Unsigned)
Complete physical exam  Patient: Ronald Graham   DOB: 1981-05-23   41 y.o. Male  MRN: [id number] Visit Date: 05/06/2023  Today's healthcare provider: Debera Lat, PA-C   No chief complaint on file.  Subjective    Ronald Graham is a 42 y.o. male who presents today for a complete physical exam.  He reports consuming a {diet types:17450} diet. {Exercise:19826} He generally feels {well/fairly well/poorly:18703}. He reports sleeping {well/fairly well/poorly:18703}. He {does/does not:200015} have additional problems to discuss today.  HPI  *** Discussed the use of AI scribe software for clinical note transcription with the patient, who gave verbal consent to proceed.  History of Present Illness            Last depression screening scores    04/29/2022    2:03 PM 10/30/2021    1:42 PM 02/03/2021    1:24 PM  PHQ 2/9 Scores  PHQ - 2 Score 0 0 0  PHQ- 9 Score 0 0 0   Last fall risk screening    04/29/2022    2:03 PM  Fall Risk   Falls in the past year? 0  Number falls in past yr: 0  Injury with Fall? 0  Risk for fall due to : No Fall Risks   Last Audit-C alcohol use screening    04/29/2022    2:03 PM  Alcohol Use Disorder Test (AUDIT)  1. How often do you have a drink containing alcohol? 2  2. How many drinks containing alcohol do you have on a typical day when you are drinking? 0  3. How often do you have six or more drinks on one occasion? 0  AUDIT-C Score 2   A score of 3 or more in women, and 4 or more in men indicates increased risk for alcohol abuse, EXCEPT if all of the points are from question 1   Past Medical History:  Diagnosis Date   Inguinal hernia    Past Surgical History:  Procedure Laterality Date   HERNIA REPAIR Left 10/28/07   inguinal   Social History   Socioeconomic History   Marital status: Married    Spouse name: Not on file   Number of children: Not on file   Years of education: Not on file   Highest education level: Not on file   Occupational History   Not on file  Tobacco Use   Smoking status: Never   Smokeless tobacco: Current    Last attempt to quit: 09/21/2014   Tobacco comments:    QUIT IN 2016 note:02/11/16 patient reports that he started back up  Substance and Sexual Activity   Alcohol use: Yes    Alcohol/week: 0.0 standard drinks of alcohol   Drug use: No   Sexual activity: Not on file  Other Topics Concern   Not on file  Social History Narrative   Not on file   Social Determinants of Health   Financial Resource Strain: Not on file  Food Insecurity: Not on file  Transportation Needs: Not on file  Physical Activity: Not on file  Stress: Not on file  Social Connections: Not on file  Intimate Partner Violence: Not on file   Family Status  Relation Name Status   Brother  Alive   Father  Alive   Mother  Alive  No partnership data on file   Family History  Problem Relation Age of Onset   Hernia Brother    Allergies  Allergen Reactions  Amoxicillin Rash    Patient Care Team: Alfredia Ferguson, PA-C as PCP - General (Physician Assistant) Lemar Livings Merrily Pew, MD as Consulting Physician (General Surgery) Anola Gurney, [place] as Referring Physician (Family Medicine)   Medications: Outpatient Medications Prior to Visit  Medication Sig   diclofenac (VOLTAREN) 75 MG EC tablet Take 1 tablet (75 mg total) by mouth 2 (two) times daily.   No facility-administered medications prior to visit.    Review of Systems  All other systems reviewed and are negative.  Except see HPI  {Insert previous labs (optional):23779} {See past labs  Heme  Chem  Endocrine  Serology  Results Review (optional):1}  Objective    There were no vitals taken for this visit. {Insert last BP/Wt (optional):23777}{See vitals history (optional):1}    Physical Exam Vitals and nursing note reviewed.  Constitutional:      General: He is not in acute distress.    Appearance: Normal appearance. He is obese. He is  not ill-appearing, toxic-appearing or diaphoretic.  HENT:     Head: Normocephalic and atraumatic.     Right Ear: Tympanic membrane, ear canal and external ear normal.     Left Ear: Tympanic membrane, ear canal and external ear normal.     Nose: Nose normal. No congestion or rhinorrhea.     Mouth/Throat:     Mouth: Mucous membranes are moist.     Pharynx: Oropharynx is clear. No oropharyngeal exudate or posterior oropharyngeal erythema.  Eyes:     General: No scleral icterus.       Right eye: No discharge.        Left eye: No discharge.     Extraocular Movements: Extraocular movements intact.     Conjunctiva/sclera: Conjunctivae normal.     Pupils: Pupils are equal, round, and reactive to light.  Neck:     Vascular: No carotid bruit.  Cardiovascular:     Rate and Rhythm: Normal rate and regular rhythm.     Pulses: Normal pulses.     Heart sounds: Normal heart sounds. No murmur heard.    No friction rub. No gallop.  Pulmonary:     Effort: Pulmonary effort is normal. No respiratory distress.     Breath sounds: Normal breath sounds. No stridor. No wheezing, rhonchi or rales.  Chest:     Chest wall: No tenderness.  Abdominal:     General: Abdomen is flat. Bowel sounds are normal. There is no distension.     Palpations: Abdomen is soft. There is no mass.     Tenderness: There is no abdominal tenderness. There is no right CVA tenderness, left CVA tenderness, guarding or rebound.     Hernia: No hernia is present.  Musculoskeletal:        General: No swelling, tenderness, deformity or signs of injury. Normal range of motion.     Cervical back: Normal range of motion. No rigidity or tenderness.     Right lower leg: No edema.     Left lower leg: No edema.  Lymphadenopathy:     Cervical: No cervical adenopathy.  Skin:    General: Skin is warm.     Capillary Refill: Capillary refill takes less than 2 seconds.     Coloration: Skin is not jaundiced or pale.     Findings: No bruising,  erythema, lesion or rash.  Neurological:     Mental Status: He is alert and oriented to person, place, and time. Mental status is at baseline.     Cranial Nerves:  No cranial nerve deficit.     Sensory: No sensory deficit.     Motor: No weakness.     Coordination: Coordination normal.     Gait: Gait normal.     Deep Tendon Reflexes: Reflexes normal.  Psychiatric:        Behavior: Behavior normal.        Thought Content: Thought content normal.        Judgment: Judgment normal.      No results found for any visits on 05/06/23.  Assessment & Plan    Routine Health Maintenance and Physical Exam  Exercise Activities and Dietary recommendations  Goals   None     Immunization History  Administered Date(s) Administered   Influenza-Unspecified 07/11/2015   PFIZER Comirnaty(Gray Top)Covid-19 Tri-Sucrose Vaccine 12/28/2019, 01/18/2020   Tdap 04/29/2022    Health Maintenance  Topic Date Due   COVID-19 Vaccine (3 - 2023-24 season) 05/22/2022   Flu Shot  04/22/2023   DTaP/Tdap/Td vaccine (2 - Td or Tdap) 04/29/2032   Hepatitis C Screening  Completed   HIV Screening  Completed   HPV Vaccine  Aged Out    Discussed health benefits of physical activity, and encouraged him to engage in regular exercise appropriate for his age and condition.  Annual physical exam UTD on dental/eye Things to do to keep yourself healthy  - Exercise at least 30-45 minutes a day, 3-4 days a week.  - Eat a low-fat diet with lots of fruits and vegetables, up to 7-9 servings per day.  - Seatbelts can save your life. Wear them always.  - Smoke detectors on every level of your home, check batteries every year.  - Eye Doctor - have an eye exam every 1-2 years  - Safe sex - if you may be exposed to STDs, use a condom.  - Alcohol -  If you drink, do it moderately, less than 2 drinks per day.  - Health Care Power of Attorney. Choose someone to speak for you if you are not able.  - Depression is common in our  stressful world.If you're feeling down or losing interest in things you normally enjoy, please come in for a visit.  - Violence - If anyone is threatening or hurting you, please call immediately.   ***  No follow-ups on file.    The patient was advised to call back or seek an in-person evaluation if the symptoms worsen or if the condition fails to improve as anticipated.  I discussed the assessment and treatment plan with the patient. The patient was provided an opportunity to ask questions and all were answered. The patient agreed with the plan and demonstrated an understanding of the instructions.  I, Debera Lat, PA-C have reviewed all documentation for this visit. The documentation on  05/06/23 for the exam, diagnosis, procedures, and orders are all accurate and complete.  Debera Lat, [facility], MMS [facility] [phone] (phone) [phone] (fax)  [facility]

## 2023-05-03 ENCOUNTER — Encounter: Payer: 59 | Admitting: Physician Assistant

## 2023-05-06 ENCOUNTER — Encounter: Payer: Self-pay | Admitting: Physician Assistant

## 2023-05-06 ENCOUNTER — Ambulatory Visit (INDEPENDENT_AMBULATORY_CARE_PROVIDER_SITE_OTHER): Payer: BC Managed Care – PPO | Admitting: Physician Assistant

## 2023-05-06 VITALS — BP 133/105 | HR 70 | Ht 72.0 in | Wt 208.4 lb

## 2023-05-06 DIAGNOSIS — Z0000 Encounter for general adult medical examination without abnormal findings: Secondary | ICD-10-CM

## 2023-05-06 DIAGNOSIS — R5383 Other fatigue: Secondary | ICD-10-CM

## 2023-05-06 DIAGNOSIS — E7800 Pure hypercholesterolemia, unspecified: Secondary | ICD-10-CM | POA: Diagnosis not present

## 2023-05-07 LAB — COMPREHENSIVE METABOLIC PANEL
ALT: 28 IU/L (ref 0–44)
AST: 20 IU/L (ref 0–40)
Albumin: 4.7 g/dL (ref 4.1–5.1)
Alkaline Phosphatase: 86 IU/L (ref 44–121)
BUN/Creatinine Ratio: 17 (ref 9–20)
BUN: 15 mg/dL (ref 6–24)
Bilirubin Total: 0.5 mg/dL (ref 0.0–1.2)
CO2: 23 mmol/L (ref 20–29)
Calcium: 9.9 mg/dL (ref 8.7–10.2)
Chloride: 103 mmol/L (ref 96–106)
Creatinine, Ser: 0.86 mg/dL (ref 0.76–1.27)
Globulin, Total: 2.1 g/dL (ref 1.5–4.5)
Glucose: 92 mg/dL (ref 70–99)
Potassium: 4.9 mmol/L (ref 3.5–5.2)
Sodium: 138 mmol/L (ref 134–144)
Total Protein: 6.8 g/dL (ref 6.0–8.5)
eGFR: 112 mL/min/{1.73_m2} (ref >59)

## 2023-05-07 LAB — LIPID PANEL
Chol/HDL Ratio: 4.6 ratio (ref 0.0–5.0)
Cholesterol, Total: 263 mg/dL — ABNORMAL HIGH (ref 100–199)
HDL: 57 mg/dL (ref >39)
LDL Chol Calc (NIH): 174 mg/dL — ABNORMAL HIGH (ref 0–99)
Triglycerides: 175 mg/dL — ABNORMAL HIGH (ref 0–149)
VLDL Cholesterol Cal: 32 mg/dL (ref 5–40)

## 2023-11-02 ENCOUNTER — Other Ambulatory Visit: Payer: Self-pay

## 2023-11-02 ENCOUNTER — Encounter: Payer: Self-pay | Admitting: Oncology

## 2023-11-02 ENCOUNTER — Ambulatory Visit (INDEPENDENT_AMBULATORY_CARE_PROVIDER_SITE_OTHER): Payer: Self-pay | Admitting: Oncology

## 2023-11-02 VITALS — BP 120/82 | HR 71 | Temp 97.8°F | Ht 72.0 in | Wt 200.0 lb

## 2023-11-02 DIAGNOSIS — R051 Acute cough: Secondary | ICD-10-CM

## 2023-11-02 MED ORDER — AZITHROMYCIN 250 MG PO TABS: ORAL_TABLET | ORAL | 0 refills | Status: AC

## 2023-11-02 MED ORDER — PREDNISONE 10 MG (21) PO TBPK: ORAL_TABLET | ORAL | 0 refills | Status: AC

## 2023-11-02 NOTE — Progress Notes (Signed)
[facility]  [address] [place], [place] [address] Phone: [phone] Fax: [phone]   Office Visit Note  Patient Name: Ronald Graham  Date of [id number]  Med Rec number [id number]  Date of Service: 11/02/2023  Amoxicillin  Chief Complaint  Patient presents with   Cough    Cough x 1 week. Cough was originally dry but he has been producing dark green sputum recently. He states he also had body aches/chills, headache, and vomiting last week which has since resolved. He also reports feeling fullness in his L ear. He has been taking Dayquil and Nyquil. He is leaving for [place] on Sunday.   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 Reports body aches, fever and chills initially which have mostly gone away except for the cough. Feels slightly better this morning as far as the other symptoms but still has the cough.  A few of his coworkers had the flu last week.  He has not tested himself.  Coughing is worse at bedtime.  He has noticed some pressure in his left ear.  Mild nasal congestion and postnasal drainage.  Reports no underlying respiratory illness although he did have asthma as a child.  Has not tried anything over-the-counter.  No recent antibiotics.  No known drug allergies.  He is leaving for [place] on Sunday.  Current Medication:  Outpatient Encounter Medications as of 11/02/2023  Medication Sig   azithromycin (ZITHROMAX) 250 MG tablet Take 2 tablets on day 1, then 1 tablet daily on days 2 through 5   predniSONE (STERAPRED UNI-PAK 21 TAB) 10 MG (21) TBPK tablet Take as directed.   [DISCONTINUED] diclofenac (VOLTAREN) 75 MG EC tablet Take 1 tablet (75 mg total) by mouth 2 (two) times daily. (Patient not taking: Reported on 05/06/2023)   No facility-administered encounter medications on file as of 11/02/2023.    Medical History: Past Medical History:  Diagnosis Date   Inguinal hernia     Vital Signs: BP 120/82   Pulse 71   Temp 97.8 F (36.6 C)   Ht 6' (1.829 m)   Wt 200 lb (90.7 kg)   SpO2 97%   BMI 27.12 kg/m   ROS: As per HPI.  All other pertinent ROS negative.     Review of Systems  Constitutional:  Positive for chills, diaphoresis and fatigue. Negative for fever.  HENT:  Positive for congestion, ear pain, postnasal drip, sinus pressure and sinus pain. Negative for sore throat.   Respiratory:  Positive for cough.   Gastrointestinal:  Negative for diarrhea, nausea and vomiting.  Musculoskeletal:  Positive for myalgias.  Neurological:  Positive for headaches. Negative for dizziness.    Physical Exam Constitutional:      Appearance: He is ill-appearing.  HENT:     Right Ear: A middle ear effusion is present.     Left Ear: A middle ear effusion is present.     Nose: Mucosal edema, congestion and rhinorrhea present.     Right Turbinates: Swollen.     Left Turbinates: Swollen.     Right Sinus: No maxillary sinus tenderness or frontal sinus tenderness.     Left Sinus: No maxillary sinus tenderness or frontal sinus tenderness.     Mouth/Throat:     Pharynx: Posterior oropharyngeal erythema and postnasal drip present.     Tonsils: No tonsillar exudate. 0 on the right. 0 on the left.  Pulmonary:  Effort: Prolonged expiration present.     Breath sounds: Decreased air movement present. No wheezing, rhonchi or rales.  Lymphadenopathy:     Cervical: No cervical adenopathy.  Neurological:     Mental Status: He is alert.     No results found for this or any previous visit (from the past 24 hours).  Assessment/Plan: 1. Acute cough (Primary) Based on exam and history, it sounds like he likely had the flu with sx onset on Friday.  Unfortunately, he is out of the 72-hour window for a prescription for Tamiflu.  On exam, he is coughing constantly and he has some prolonged expiration bilaterally indicating inflammation.  We discussed treating for bronchitis.   Discussed prednisone taper take 6 tabs on day 1 followed by 5 tablets on day 2 and decrease by 1 tablet until complete along with azithromycin 250 mg tablets.  Take 2 tabs on day 1 followed by 1 tablet until complete.  Recommend he continue over-the-counter medications with DayQuil/NyQuil or Mucinex along with Flonase 2 sprays each nostril twice daily.  May use Delsym for cough.  Please let me know if your symptoms are not improving by the end of the week and we can get a chest x-ray.  - azithromycin (ZITHROMAX) 250 MG tablet; Take 2 tablets on day 1, then 1 tablet daily on days 2 through 5  Dispense: 6 tablet; Refill: 0 - predniSONE (STERAPRED UNI-PAK 21 TAB) 10 MG (21) TBPK tablet; Take as directed.  Dispense: 21 tablet; Refill: 0   General Counseling: Boyde verbalizes understanding of the findings of todays visit and agrees with plan of treatment. I have discussed any further diagnostic evaluation that may be needed or ordered today. We also reviewed his medications today. he has been encouraged to call the office with any questions or concerns that should arise related to todays visit.   No orders of the defined types were placed in this encounter.   Meds ordered this encounter  Medications   azithromycin (ZITHROMAX) 250 MG tablet    Sig: Take 2 tablets on day 1, then 1 tablet daily on days 2 through 5    Dispense:  6 tablet    Refill:  0   predniSONE (STERAPRED UNI-PAK 21 TAB) 10 MG (21) TBPK tablet    Sig: Take as directed.    Dispense:  21 tablet    Refill:  0    I spent 20 minutes dedicated to the care of this patient (face-to-face and non-face-to-face) on the date of the encounter to include what is described in the assessment and plan.   Durenda Hurt, NP 11/02/2023 10:00 AM
